# Patient Record
Sex: Female | Born: 1995 | Race: White | Hispanic: No | Smoking: Former smoker
Health system: Southern US, Community
[De-identification: ages and names within clinical notes are randomized; demographics above are authoritative.]

## PROBLEM LIST (undated history)

## (undated) ENCOUNTER — Inpatient Hospital Stay (HOSPITAL_COMMUNITY): Payer: Self-pay

## (undated) DIAGNOSIS — S022XXA Fracture of nasal bones, initial encounter for closed fracture: Secondary | ICD-10-CM

## (undated) DIAGNOSIS — F419 Anxiety disorder, unspecified: Secondary | ICD-10-CM

## (undated) DIAGNOSIS — F32A Depression, unspecified: Secondary | ICD-10-CM

## (undated) DIAGNOSIS — F329 Major depressive disorder, single episode, unspecified: Secondary | ICD-10-CM

## (undated) DIAGNOSIS — K37 Unspecified appendicitis: Secondary | ICD-10-CM

## (undated) HISTORY — PX: APPENDECTOMY: SHX54

---
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
## 2015-05-11 ENCOUNTER — Encounter (HOSPITAL_COMMUNITY): Payer: Self-pay | Admitting: *Deleted

## 2015-05-11 ENCOUNTER — Encounter (HOSPITAL_COMMUNITY): Payer: Self-pay

## 2015-05-11 ENCOUNTER — Emergency Department (HOSPITAL_COMMUNITY)
Admission: EM | Admit: 2015-05-11 | Discharge: 2015-05-11 | Disposition: A | Discharge status: Other Acute Inpatient Hospital | Payer: Medicaid Other | Attending: Emergency Medicine | Admitting: Emergency Medicine

## 2015-05-11 ENCOUNTER — Inpatient Hospital Stay (HOSPITAL_COMMUNITY)
Admission: AD | Admit: 2015-05-11 | Discharge: 2015-05-15 | DRG: 885 | Disposition: A | Discharge status: Home / Self Care | Payer: Medicaid Other | Source: Intra-hospital | Attending: Psychiatry | Admitting: Psychiatry

## 2015-05-11 DIAGNOSIS — F332 Major depressive disorder, recurrent severe without psychotic features: Principal | ICD-10-CM | POA: Diagnosis present

## 2015-05-11 DIAGNOSIS — Z62810 Personal history of physical and sexual abuse in childhood: Secondary | ICD-10-CM | POA: Diagnosis present

## 2015-05-11 DIAGNOSIS — F411 Generalized anxiety disorder: Secondary | ICD-10-CM | POA: Diagnosis present

## 2015-05-11 DIAGNOSIS — G4700 Insomnia, unspecified: Secondary | ICD-10-CM | POA: Diagnosis present

## 2015-05-11 DIAGNOSIS — F410 Panic disorder [episodic paroxysmal anxiety] without agoraphobia: Secondary | ICD-10-CM | POA: Diagnosis present

## 2015-05-11 DIAGNOSIS — F1210 Cannabis abuse, uncomplicated: Secondary | ICD-10-CM | POA: Diagnosis not present

## 2015-05-11 DIAGNOSIS — R45851 Suicidal ideations: Secondary | ICD-10-CM | POA: Diagnosis present

## 2015-05-11 DIAGNOSIS — Z818 Family history of other mental and behavioral disorders: Secondary | ICD-10-CM

## 2015-05-11 DIAGNOSIS — Z720 Tobacco use: Secondary | ICD-10-CM | POA: Insufficient documentation

## 2015-05-11 DIAGNOSIS — R4689 Other symptoms and signs involving appearance and behavior: Secondary | ICD-10-CM

## 2015-05-11 DIAGNOSIS — Z23 Encounter for immunization: Secondary | ICD-10-CM | POA: Diagnosis not present

## 2015-05-11 DIAGNOSIS — F4310 Post-traumatic stress disorder, unspecified: Secondary | ICD-10-CM | POA: Diagnosis not present

## 2015-05-11 DIAGNOSIS — F17210 Nicotine dependence, cigarettes, uncomplicated: Secondary | ICD-10-CM | POA: Diagnosis present

## 2015-05-11 DIAGNOSIS — R4589 Other symptoms and signs involving emotional state: Secondary | ICD-10-CM

## 2015-05-11 HISTORY — DX: Depression, unspecified: F32.A

## 2015-05-11 HISTORY — DX: Anxiety disorder, unspecified: F41.9

## 2015-05-11 HISTORY — DX: Major depressive disorder, single episode, unspecified: F32.9

## 2015-05-11 LAB — CBC
HEMATOCRIT: 40.1 % (ref 36.0–46.0)
HEMOGLOBIN: 14.6 g/dL (ref 12.0–15.0)
MCH: 31.8 pg (ref 26.0–34.0)
MCHC: 36.4 g/dL — AB (ref 30.0–36.0)
MCV: 87.4 fL (ref 78.0–100.0)
Platelets: 300 10*3/uL (ref 150–400)
RBC: 4.59 MIL/uL (ref 3.87–5.11)
RDW: 12.9 % (ref 11.5–15.5)
WBC: 6.4 10*3/uL (ref 4.0–10.5)

## 2015-05-11 LAB — COMPREHENSIVE METABOLIC PANEL
ALBUMIN: 4.5 g/dL (ref 3.5–5.0)
ALK PHOS: 82 U/L (ref 38–126)
ALT: 11 U/L — AB (ref 14–54)
AST: 21 U/L (ref 15–41)
Anion gap: 9 (ref 5–15)
BILIRUBIN TOTAL: 1 mg/dL (ref 0.3–1.2)
BUN: 8 mg/dL (ref 6–20)
CALCIUM: 9.4 mg/dL (ref 8.9–10.3)
CO2: 24 mmol/L (ref 22–32)
CREATININE: 0.64 mg/dL (ref 0.44–1.00)
Chloride: 103 mmol/L (ref 101–111)
GFR calc Af Amer: >60 mL/min (ref >60)
GFR calc non Af Amer: >60 mL/min (ref >60)
GLUCOSE: 113 mg/dL — AB (ref 65–99)
Potassium: 4 mmol/L (ref 3.5–5.1)
SODIUM: 136 mmol/L (ref 135–145)
TOTAL PROTEIN: 7.3 g/dL (ref 6.5–8.1)

## 2015-05-11 LAB — ETHANOL: Alcohol, Ethyl (B): <5 mg/dL (ref <5)

## 2015-05-11 LAB — ACETAMINOPHEN LEVEL

## 2015-05-11 LAB — RAPID URINE DRUG SCREEN, HOSP PERFORMED
Amphetamines: NOT DETECTED
BARBITURATES: NOT DETECTED
Benzodiazepines: NOT DETECTED
COCAINE: NOT DETECTED
Opiates: NOT DETECTED
TETRAHYDROCANNABINOL: POSITIVE — AB

## 2015-05-11 LAB — SALICYLATE LEVEL: Salicylate Lvl: <4 mg/dL (ref 2.8–30.0)

## 2015-05-11 MED ORDER — NICOTINE 21 MG/24HR TD PT24
21.0000 mg | MEDICATED_PATCH | Freq: Every day | TRANSDERMAL | Status: DC
  Administered 2015-05-12: 21 mg via TRANSDERMAL
  Filled 2015-05-11 (×5): qty 1

## 2015-05-11 MED ORDER — IBUPROFEN 200 MG PO TABS
600.0000 mg | ORAL_TABLET | Freq: Three times a day (TID) | ORAL | Status: DC | PRN
Start: 2015-05-11 — End: 2015-05-11

## 2015-05-11 MED ORDER — ACETAMINOPHEN 325 MG PO TABS
650.0000 mg | ORAL_TABLET | Freq: Four times a day (QID) | ORAL | Status: DC | PRN
  Administered 2015-05-13 – 2015-05-15 (×2): 650 mg via ORAL
  Filled 2015-05-11 (×2): qty 2

## 2015-05-11 MED ORDER — ONDANSETRON HCL 4 MG PO TABS: 4.0000 mg | ORAL_TABLET | Freq: Three times a day (TID) | ORAL | Status: DC | PRN

## 2015-05-11 MED ORDER — ACETAMINOPHEN 325 MG PO TABS: 650.0000 mg | ORAL_TABLET | ORAL | Status: DC | PRN

## 2015-05-11 MED ORDER — PNEUMOCOCCAL VAC POLYVALENT 25 MCG/0.5ML IJ INJ
0.5000 mL | INJECTION | INTRAMUSCULAR | Status: AC
  Administered 2015-05-13: 0.5 mL via INTRAMUSCULAR

## 2015-05-11 MED ORDER — HYDROXYZINE HCL 25 MG PO TABS
25.0000 mg | ORAL_TABLET | Freq: Four times a day (QID) | ORAL | Status: DC | PRN
  Administered 2015-05-11 – 2015-05-15 (×6): 25 mg via ORAL
  Filled 2015-05-11 (×6): qty 1

## 2015-05-11 MED ORDER — LORAZEPAM 1 MG PO TABS: 1.0000 mg | ORAL_TABLET | Freq: Three times a day (TID) | ORAL | Status: DC | PRN

## 2015-05-11 MED ORDER — TRAZODONE HCL 50 MG PO TABS
50.0000 mg | ORAL_TABLET | Freq: Every evening | ORAL | Status: DC | PRN
  Administered 2015-05-11 – 2015-05-12 (×2): 50 mg via ORAL
  Filled 2015-05-11 (×2): qty 1

## 2015-05-11 NOTE — ED Notes (Signed)
Pt. Stated, "i am having bad thoughts of hurting myself."    Attempted in the past of, pt. Had Overdose.  Pt. Would like to have her meds restarted.  She takes medication for anxiety and depression.
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
## 2015-05-11 NOTE — ED Notes (Signed)
Staffing called for sitter. They state it may be 1900 before they can get one.

## 2015-05-11 NOTE — Tx Team (Signed)
Initial Interdisciplinary Treatment Plan   PATIENT STRESSORS: Financial difficulties Medication change or noncompliance Substance abuse Daughter medical problems   PATIENT STRENGTHS: Ability for insight Average or above average intelligence Capable of independent living Communication skills Motivation for treatment/growth   PROBLEM LIST: Problem List/Patient Goals Date to be addressed Date deferred Reason deferred Estimated date of resolution  Depression "I want to get back on medication" 05/11/15   At d/c  anxiety 05/11/15   At d/c  Suicidal ideation "I have a daughter to care for and don't want to hurt myself" 05/11/15   At d/c  Substance abuse " I want to stop using marijuana as a medication" 05/11/15   At d/c                                 DISCHARGE CRITERIA:  Improved stabilization in mood, thinking, and/or behavior Motivation to continue treatment in a less acute level of care Need for constant or close observation no longer present Verbal commitment to aftercare and medication compliance  PRELIMINARY DISCHARGE PLAN: Outpatient therapy Return to previous living arrangement  PATIENT/FAMIILY INVOLVEMENT: This treatment plan has been presented to and reviewed with the patient, Kaitlyn Larson. The patient and family have been given the opportunity to ask questions and make suggestions.  Celene KrasRobinson, Lanissa Cashen G 05/11/2015, 9:37 PM

## 2015-05-11 NOTE — Progress Notes (Signed)
Pt admitted to Adult Unit. Pt presents calm and cooperative. Pt request help for depression, anxiety and suicidal ideation "I feel my anxiety and depression slipping back in". Pt reported wanting to go to sleep and not waking up "But I have a daughter to take care of". -HI/A/V/H. Reports multiple stressors being financial problems,  off medication, unemployed and  her 21 yo daughter getting bite by a dog in the face 6 months ago and facing many future surgeries.   Pt admits to crying spells, social withdrawal, loss of interest in usual pleasures, decreased concentration, fatigue, irritability, decreased sleep, decreased appetite and feelings of hopelessness.  Pt has a history or cutting and overdosing.  Emotional support and encouragement given. Will monitor closely and evaluate for stabilization.

## 2015-05-11 NOTE — ED Provider Notes (Signed)
CSN: [id number]     Arrival date & time 05/11/15  1545 History   First MD Initiated Contact with Patient 05/11/15 1603     Chief Complaint  Patient presents with  . Suicidal     (Consider location/radiation/quality/duration/timing/severity/associated sxs/prior Treatment) HPI Comments: Patient is a 19 year old female with a past medical history of anxiety and depression who presents with suicidal ideations. Patient reports having these "for a while" but became worse lately with increasing life stressors. Patient reports having these feelings previously and overdosed on "a bunch of pills" and was seen at a hospital in [place]. Today, patient reports she wants to go to sleep and not wake up or would try to run away and have someone kill her. No HI. Patient reports using marijuana but denies any other drug use.    Past Medical History  Diagnosis Date  . Anxiety   . Depression    Past Surgical History  Procedure Laterality Date  . Appendectomy     No family history on file. Social History  Substance Use Topics  . Smoking status: Current Every Day Smoker    Types: Cigarettes  . Smokeless tobacco: None  . Alcohol Use: No   OB History    No data available     Review of Systems  Psychiatric/Behavioral: Positive for suicidal ideas.  All other systems reviewed and are negative.     Allergies  Lactose intolerance (gi)  Home Medications   Prior to Admission medications   Not on File   BP 129/69 mmHg  Pulse 104  Temp(Src) 98.6 F (37 C) (Oral)  Resp 18  SpO2 97%  LMP 04/27/2015 Physical Exam  Constitutional: She is oriented to person, place, and time. She appears well-developed and well-nourished. No distress.  HENT:  Head: Normocephalic and atraumatic.  Eyes: Conjunctivae and EOM are normal.  Neck: Normal range of motion.  Cardiovascular: Normal rate and regular rhythm.  Exam reveals no gallop and no friction rub.   No murmur heard. Pulmonary/Chest: Effort normal  and breath sounds normal. She has no wheezes. She has no rales. She exhibits no tenderness.  Abdominal: Soft. She exhibits no distension. There is no tenderness. There is no rebound.  Musculoskeletal: Normal range of motion.  Neurological: She is alert and oriented to person, place, and time. Coordination normal.  Speech is goal-oriented. Moves limbs without ataxia.   Skin: Skin is warm and dry.  Psychiatric: She has a normal mood and affect. Her behavior is normal.  Nursing note and vitals reviewed.   ED Course  Procedures (including critical care time) Labs Review Labs Reviewed  COMPREHENSIVE METABOLIC PANEL - Abnormal; Notable for the following:    Glucose, Bld 113 (*)    ALT 11 (*)    All other components within normal limits  ACETAMINOPHEN LEVEL - Abnormal; Notable for the following:    Acetaminophen (Tylenol), Serum <10 (*)    All other components within normal limits  CBC - Abnormal; Notable for the following:    MCHC 36.4 (*)    All other components within normal limits  URINE RAPID DRUG SCREEN, HOSP PERFORMED - Abnormal; Notable for the following:    Tetrahydrocannabinol POSITIVE (*)    All other components within normal limits  ETHANOL  SALICYLATE LEVEL    Imaging Review No results found. I have personally reviewed and evaluated these images and lab results as part of my medical decision-making.   EKG Interpretation None      MDM  Final diagnoses:  Suicidal behavior    4:26 PM Patient's labs pending. Patient will be moved to Pod C for telepsych.   Patient has been accepted to a treatment facility and will be discharged and transferred via Evans Lance, PA-C 05/11/15 1610  Arby Barrette, MD 05/12/15 [id number]

## 2015-05-12 ENCOUNTER — Encounter (HOSPITAL_COMMUNITY): Payer: Self-pay | Admitting: Psychiatry

## 2015-05-12 DIAGNOSIS — R45851 Suicidal ideations: Secondary | ICD-10-CM

## 2015-05-12 DIAGNOSIS — F4310 Post-traumatic stress disorder, unspecified: Secondary | ICD-10-CM | POA: Diagnosis present

## 2015-05-12 DIAGNOSIS — F332 Major depressive disorder, recurrent severe without psychotic features: Principal | ICD-10-CM

## 2015-05-12 MED ORDER — ESCITALOPRAM OXALATE 5 MG PO TABS
5.0000 mg | ORAL_TABLET | Freq: Every day | ORAL | Status: DC
  Administered 2015-05-12 – 2015-05-13 (×2): 5 mg via ORAL
  Filled 2015-05-12 (×3): qty 1

## 2015-05-12 NOTE — Progress Notes (Signed)
D) Pt rated her depression at a 9, her hopelessness at a 7 and her anxiety at a 9. States that she was having suicidal thoughts today.  States, "ever since I took the medicine that the doctor put me on today, I have been feeling much better". Went outside with the group and became anxious due to riding in the elevator. States, "small spaces make me upset and cause me to be anxious. A) Given support, reassurance and praise. Provided with a 1:1. Encouragement given. Pt asked for and received Vistaril for her anxiety. R) Presently Pt is feeling better and denies SI and HI. Feels that the medication that was started today has help her greatly already.

## 2015-05-12 NOTE — Progress Notes (Signed)
D.  Pt pleasant on approach, denies complaints at this time.   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  Pt is interacting appropriately within milieu.  Denies SI/HI/hallucinations at this time.  A.  Support and encouragement offered, medications given as ordered.  R.  Pt remains safe on unit, will continue to monitor.
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
## 2015-05-12 NOTE — Progress Notes (Signed)
Patient ID: Rolinda RoanChastity J Larson, female   DOB: 04/30/1996, 19 y.o.   MRN: [id number]  Adult Psychoeducational Group Note  Date:  05/12/2015 Time: 09:30am  Group Topic/Focus:  Making Healthy Choices:   The focus of this group is to help patients identify negative/unhealthy choices they were using prior to admission and identify positive/healthier coping strategies to replace them upon discharge.  Participation Level:  Did Not Attend  Participation Quality: n/a  Affect: n/a  Cognitive: n/a  Insight: n/a  Engagement in Group: n/a  Modes of Intervention:  Activity, Education and Support  Additional Comments:  Pt did not attend group. Pt in bed asleep.   Aurora Maskwyman, Tearah Saulsbury E 05/12/2015, 10:03 AM

## 2015-05-12 NOTE — Plan of Care (Signed)
Problem: Diagnosis: Increased Risk For Suicide Attempt Goal: STG-Patient Will Attend All Groups On The Unit Outcome: Progressing Pt did attend evening wrap up group tonight
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
## 2015-05-12 NOTE — [facility] Group Notes (Signed)
[facility] Group Notes:  (Clinical Social Work)  05/12/2015  10:00-11:00AM  Summary of Progress/Problems:   The main focus of today's process group was to   1)  discuss the importance of adding supports  2)  define health supports versus unhealthy supports  3)  identify the patient's current unhealthy supports and plan how to handle them  4)  Identify the patient's current healthy supports and plan what to add.  An emphasis was placed on using counselor, doctor, therapy groups, 12-step groups, and problem-specific support groups to expand supports.    The patient expressed full comprehension of the concepts presented, and agreed that there is a need to add more supports.  The patient left for another group on another hall after saying her supports include her husband, mother, and mother-in-law.  Type of Therapy:  Process Group with Motivational Interviewing  Participation Level:  Active  Participation Quality:  Attentive and Sharing  Affect:  Blunted  Cognitive:  Alert  Insight:  Engaged  Engagement in Therapy:  Engaged  Modes of Intervention:   Education, Teacher, English as a foreign languageupport and Processing, Activity  Ambrose MantleMareida Grossman-Orr, LCSW 05/12/2015

## 2015-05-12 NOTE — [facility] Suicide Risk Assessment (Signed)
[facility]   Nursing information obtained from:  Patient Demographic factors:  Adolescent or young adult, Caucasian, Low socioeconomic status, Unemployed Current Mental Status:  Self-harm thoughts Loss Factors:  Financial problems / change in socioeconomic status Historical Factors:  Prior suicide attempts, Family history of suicide, Family history of mental illness or substance abuse Risk Reduction Factors:  Responsible for children under 21 years of age, Living with another person, especially a relative, Positive social support Total Time spent with patient: 45 minutes Principal Problem: MDD (major depressive disorder), recurrent episode, severe ([facility]) Diagnosis:   Patient Active Problem List   Diagnosis Date Noted  . PTSD (post-traumatic stress disorder) [F43.10] 05/12/2015  . MDD (major depressive disorder), recurrent episode, severe ([facility]) [F33.2] 05/11/2015     Continued Clinical Symptoms:  Alcohol Use Disorder Identification Test Final Score (AUDIT): 1 The "Alcohol Use Disorders Identification Test", Guidelines for Use in Primary Care, Second Edition.  World Science writerHealth Organization [facility](WHO). Score between 0-7:  no or low risk or alcohol related problems. Score between 8-15:  moderate risk of alcohol related problems. Score between 16-19:  high risk of alcohol related problems. Score 20 or above:  warrants further diagnostic evaluation for alcohol dependence and treatment.   CLINICAL FACTORS: Depression/severe    Psychiatric Specialty Exam: Physical Exam  ROS  Blood pressure 108/70, pulse 77, temperature 98.1 F (36.7 C), temperature source Oral, resp. rate 16, height 5\' 2"  (1.575 m), weight 56.7 kg (125 lb), last menstrual period 04/27/2015.Body mass index is 22.86 kg/(m^2).    COGNITIVE FEATURES THAT CONTRIBUTE TO RISK:  Closed-mindedness, Polarized thinking and Thought constriction (tunnel vision)    SUICIDE RISK:   Moderate:  Frequent suicidal  ideation with limited intensity, and duration, some specificity in terms of plans, no associated intent, good self-control, limited dysphoria/symptomatology, some risk factors present, and identifiable protective factors, including available and accessible social support.  PLAN OF CARE: see admission H and P  Medical Decision Making:  Review of Psycho-Social Stressors (1), Review or order clinical lab tests (1), Review of Medication Regimen & Side Effects (2) and Review of New Medication or Change in Dosage (2)  I certify that inpatient services furnished can reasonably be expected to improve the patient's condition.   Kaitlyn Larson A 05/12/2015, 5:36 PM

## 2015-05-13 MED ORDER — TRAZODONE HCL 50 MG PO TABS
50.0000 mg | ORAL_TABLET | Freq: Every day | ORAL | Status: DC
  Administered 2015-05-13 – 2015-05-14 (×2): 50 mg via ORAL
  Filled 2015-05-13 (×4): qty 1

## 2015-05-13 MED ORDER — NICOTINE POLACRILEX 2 MG MT GUM
2.0000 mg | CHEWING_GUM | OROMUCOSAL | Status: DC | PRN
  Administered 2015-05-13: 2 mg via ORAL
  Filled 2015-05-13: qty 1

## 2015-05-13 MED ORDER — ESCITALOPRAM OXALATE 10 MG PO TABS
10.0000 mg | ORAL_TABLET | Freq: Every day | ORAL | Status: DC
  Administered 2015-05-14 – 2015-05-15 (×2): 10 mg via ORAL
  Filled 2015-05-13 (×4): qty 1

## 2015-05-13 MED ORDER — IBUPROFEN 800 MG PO TABS
800.0000 mg | ORAL_TABLET | Freq: Four times a day (QID) | ORAL | Status: DC | PRN
  Administered 2015-05-14: 800 mg via ORAL
  Filled 2015-05-13: qty 1

## 2015-05-13 NOTE — Progress Notes (Signed)
[facility] MD Progress Note  05/13/2015 3:47 PM Kaitlyn Larson  MRN:  [id number] Subjective:  Roda states she is having a hard time being away from her daughter. She states she really wants to get stronger to be able to get a job and get a place for her and her daughter. States she is tired of asking her husband to get a job or to keep a job. States she needs to get out from where she is with his family. States she loves him but she needs to move on Principal Problem: MDD (major depressive disorder), recurrent episode, severe ([place]) Diagnosis:   Patient Active Problem List   Diagnosis Date Noted  . PTSD (post-traumatic stress disorder) [F43.10] 05/12/2015  . MDD (major depressive disorder), recurrent episode, severe ([place]) [F33.2] 05/11/2015   Total Time spent with patient: 30 minutes  Past Psychiatric History: See Admission H and P  Past Medical History:  Past Medical History  Diagnosis Date  . Anxiety   . Depression     Past Surgical History  Procedure Laterality Date  . Appendectomy     Family History: History reviewed. No pertinent family history. Family Psychiatric  History: See Admission H and P Social History:  History  Alcohol Use No     History  Drug Use  . Yes  . Special: Marijuana    Social History   Social History  . Marital Status: Married    Spouse Name: N/A  . Number of Children: N/A  . Years of Education: N/A   Social History Main Topics  . Smoking status: Current Every Day Smoker    Types: Cigarettes  . Smokeless tobacco: None  . Alcohol Use: No  . Drug Use: Yes    Special: Marijuana  . Sexual Activity: Yes    Birth Control/ Protection: None   Other Topics Concern  . None   Social History Narrative   Additional Social History:    Pain Medications: denies Prescriptions: denies Over the Counter: denies History of alcohol / drug use?: Yes Longest period of sobriety (when/how long): a few months Name of Substance 1: marijuana 1 - Age of  First Use: 14 1 - Amount (size/oz): variable 1 - Frequency: 2 x week 1 - Duration: years 1 - Last Use / Amount: yesterday Name of Substance 2: alcohol 2 - Amount (size/oz): 1 glass 2 - Frequency: 1 x month                Sleep: Fair  Appetite:  Poor  Current Medications: Current Facility-Administered Medications  Medication Dose Route Frequency Provider Last Rate Last Dose  . acetaminophen (TYLENOL) tablet 650 mg  650 mg Oral Q6H PRN Lurena Nida, NP   650 mg at 05/13/15 1258  . [START ON 05/14/2015] escitalopram (LEXAPRO) tablet 10 mg  10 mg Oral Daily Nicholaus Bloom, MD      . hydrOXYzine (ATARAX/VISTARIL) tablet 25 mg  25 mg Oral Q6H PRN Lurena Nida, NP   25 mg at 05/12/15 2155  . ibuprofen (ADVIL,MOTRIN) tablet 800 mg  800 mg Oral Q6H PRN Nicholaus Bloom, MD      . nicotine polacrilex (NICORETTE) gum 2 mg  2 mg Oral PRN Nicholaus Bloom, MD   2 mg at 05/13/15 1342  . traZODone (DESYREL) tablet 50 mg  50 mg Oral QHS Nicholaus Bloom, MD        Lab Results:  Results for orders placed or performed during the hospital encounter  of 05/11/15 (from the past 48 hour(s))  Ethanol (ETOH)     Status: None   Collection Time: 05/11/15  4:17 PM  Result Value Ref Range   Alcohol, Ethyl (B) <5 <5 mg/dL    Comment:        LOWEST DETECTABLE LIMIT FOR SERUM ALCOHOL IS 5 mg/dL FOR MEDICAL PURPOSES ONLY   Salicylate level     Status: None   Collection Time: 05/11/15  4:17 PM  Result Value Ref Range   Salicylate Lvl <5.4 2.8 - 30.0 mg/dL  Acetaminophen level     Status: Abnormal   Collection Time: 05/11/15  4:17 PM  Result Value Ref Range   Acetaminophen (Tylenol), Serum <10 (L) 10 - 30 ug/mL    Comment:        THERAPEUTIC CONCENTRATIONS VARY SIGNIFICANTLY. A RANGE OF 10-30 ug/mL MAY BE AN EFFECTIVE CONCENTRATION FOR MANY PATIENTS. HOWEVER, SOME ARE BEST TREATED AT CONCENTRATIONS OUTSIDE THIS RANGE. ACETAMINOPHEN CONCENTRATIONS >150 ug/mL AT 4 HOURS AFTER INGESTION AND >50 ug/mL  AT 12 HOURS AFTER INGESTION ARE OFTEN ASSOCIATED WITH TOXIC REACTIONS.   Comprehensive metabolic panel     Status: Abnormal   Collection Time: 05/11/15  4:22 PM  Result Value Ref Range   Sodium 136 135 - 145 mmol/L   Potassium 4.0 3.5 - 5.1 mmol/L   Chloride 103 101 - 111 mmol/L   CO2 24 22 - 32 mmol/L   Glucose, Bld 113 (H) 65 - 99 mg/dL   BUN 8 6 - 20 mg/dL   Creatinine, Ser 0.64 0.44 - 1.00 mg/dL   Calcium 9.4 8.9 - 10.3 mg/dL   Total Protein 7.3 6.5 - 8.1 g/dL   Albumin 4.5 3.5 - 5.0 g/dL   AST 21 15 - 41 U/L   ALT 11 (L) 14 - 54 U/L   Alkaline Phosphatase 82 38 - 126 U/L   Total Bilirubin 1.0 0.3 - 1.2 mg/dL   GFR calc non Af Amer >60 >60 mL/min   GFR calc Af Amer >60 >60 mL/min    Comment: (NOTE) The eGFR has been calculated using the CKD EPI equation. This calculation has not been validated in all clinical situations. eGFR's persistently <60 mL/min signify possible Chronic Kidney Disease.    Anion gap 9 5 - 15  CBC     Status: Abnormal   Collection Time: 05/11/15  4:22 PM  Result Value Ref Range   WBC 6.4 4.0 - 10.5 K/uL   RBC 4.59 3.87 - 5.11 MIL/uL   Hemoglobin 14.6 12.0 - 15.0 g/dL   HCT 40.1 36.0 - 46.0 %   MCV 87.4 78.0 - 100.0 fL   MCH 31.8 26.0 - 34.0 pg   MCHC 36.4 (H) 30.0 - 36.0 g/dL   RDW 12.9 11.5 - 15.5 %   Platelets 300 150 - 400 K/uL  Urine rapid drug screen (hosp performed) (Not at [facility])     Status: Abnormal   Collection Time: 05/11/15  4:34 PM  Result Value Ref Range   Opiates NONE DETECTED NONE DETECTED   Cocaine NONE DETECTED NONE DETECTED   Benzodiazepines NONE DETECTED NONE DETECTED   Amphetamines NONE DETECTED NONE DETECTED   Tetrahydrocannabinol POSITIVE (A) NONE DETECTED   Barbiturates NONE DETECTED NONE DETECTED    Comment:        DRUG SCREEN FOR MEDICAL PURPOSES ONLY.  IF CONFIRMATION IS NEEDED FOR ANY PURPOSE, NOTIFY LAB WITHIN 5 DAYS.        LOWEST DETECTABLE LIMITS FOR URINE  DRUG SCREEN Drug Class       Cutoff  (ng/mL) Amphetamine      1000 Barbiturate      200 Benzodiazepine   545 Tricyclics       625 Opiates          300 Cocaine          300 THC              50     Physical Findings: AIMS: Facial and Oral Movements Muscles of Facial Expression: None, normal Lips and Perioral Area: None, normal Jaw: None, normal Tongue: None, normal,Extremity Movements Upper (arms, wrists, hands, fingers): None, normal Lower (legs, knees, ankles, toes): None, normal, Trunk Movements Neck, shoulders, hips: None, normal, Overall Severity Severity of abnormal movements (highest score from questions above): None, normal Incapacitation due to abnormal movements: None, normal Patient's awareness of abnormal movements (rate only patient's report): No Awareness, Dental Status Current problems with teeth and/or dentures?: No Does patient usually wear dentures?: No  CIWA:    COWS:     Musculoskeletal: Strength & Muscle Tone: within normal limits Gait & Station: normal Patient leans: normal  Psychiatric Specialty Exam: Review of Systems  Constitutional: Negative.   HENT: Negative.   Eyes: Negative.   Respiratory: Negative.   Cardiovascular: Negative.   Gastrointestinal: Negative.   Genitourinary: Negative.   Musculoskeletal: Negative.   Skin: Negative.   Neurological: Negative.   Endo/Heme/Allergies: Negative.   Psychiatric/Behavioral: Positive for depression. The patient is nervous/anxious.     Blood pressure 81/56, pulse 67, temperature 98.1 F (36.7 C), temperature source Oral, resp. rate 18, height $RemoveBe'5\' 2"'pYcaOuJJb$  (1.575 m), weight 56.7 kg (125 lb), last menstrual period 04/27/2015.Body mass index is 22.86 kg/(m^2).  General Appearance: Fairly Groomed  Engineer, water::  Fair  Speech:  Clear and Coherent  Volume:  Decreased  Mood:  Anxious and Depressed  Affect:  anxious worried  Thought Process:  Coherent and Goal Directed  Orientation:  Full (Time, Place, and Person)  Thought Content:  symptoms  events worries concerns  Suicidal Thoughts:  No  Homicidal Thoughts:  No  Memory:  Immediate;   Fair Recent;   Fair Remote;   Fair  Judgement:  Fair  Insight:  Present  Psychomotor Activity:  Restlessness  Concentration:  Fair  Recall:  [employer] of Knowledge:Fair  Language: Fair  Akathisia:  No  Handed:  Right  AIMS (if indicated):     Assets:  Desire for Improvement  ADL's:  Intact  Cognition: WNL  Sleep:  Number of Hours: 5.5   Treatment Plan Summary: Daily contact with patient to assess and evaluate symptoms and progress in treatment and Medication management Supportive approach/coping skills Depression; will continue to work with the Lexapro, will increase the dose to 10 mg. Will work with CBT/mindfulness PTSD; will help process the losses her daughters trauma her irrational guilt etc.  Insomnia; will continue to work with the Trazodone 50 mg HS  Ceira Hoeschen A 05/13/2015, 3:47 PM
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
## 2015-05-13 NOTE — [facility] Group Notes (Signed)
[facility]   05/13/2015 10:57 AM  Participation Quality:  Invited- DID NOT ATTEND. Chose to remain in bed.   Smart, American FinancialHeather LCSWA

## 2015-05-13 NOTE — Progress Notes (Signed)
[facility] Group Notes:  (Nursing/MHT/Case Management/Adjunct)  Date:  05/13/2015  Time:  1:27 AM  Type of Therapy:  Psychoeducational Skills  Participation Level:  Active  Participation Quality:  Appropriate  Affect:  Appropriate  Cognitive:  Appropriate  Insight:  Appropriate  Engagement in Group:  Engaged  Modes of Intervention:  Education  Summary of Progress/Problems: Patient expressed in group that she had a better day today since she is beginning to feel better and because she feels that the medication is beginning to work. As for the theme of the day, her support will consist of her husband and mother.   Donnajean Chesnut S 05/13/2015, 1:27 AM

## 2015-05-13 NOTE — Progress Notes (Signed)
Nutrition Brief Note  Patient identified on the Malnutrition Screening Tool (MST) Report  Wt Readings from Last 15 Encounters:  05/11/15 125 lb (56.7 kg)    Body mass index is 22.86 kg/(m^2). Patient meets criteria for normal range based on current BMI.   Diet Order: Diet regular Room service appropriate?: Yes; Fluid consistency:: Thin Pt is also offered choice of unit snacks mid-morning and mid-afternoon.   Labs and medications reviewed.   No nutrition interventions warranted at this time. If nutrition issues arise, please consult RD.   Tilda FrancoLindsey Jevin Camino, MS, RD, LDN Pager: [phone] After Hours Pager: [phone]
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
## 2015-05-13 NOTE — Progress Notes (Signed)
D) Pt has been isolative to her room. Seclusive to self. Pt has not attended groups or unit activities however pt has been up for meals. Kaitlyn Larson c/o headache this a.m. But did not want any medication for this. Pt appears anxious and depressed in mood and affect. A) Level 3 obs for safety, support and encouragement provided. Med ed reinforced. Medication offered for c/o headache. Fluids offered for decreased bp. Contract for safety. R) Guarded.

## 2015-05-13 NOTE — Progress Notes (Signed)
Recreation Therapy Notes  Date: 10.17.2016 Time: 9:30am Location: 300 Hall Dayroom   Group Topic: Stress Management  Goal Area(s) Addresses:  Patient will actively participate in stress management techniques presented during session.   Behavioral Response: Did not attend.   Marykay Lexenise L Quanita Barona, LRT/CTRS        Adlee Paar L 05/13/2015 1:55 PM

## 2015-05-13 NOTE — Progress Notes (Addendum)
Pt attended group on loss and grief facilitated by counseling intern Graciela HusbandsKathryn Cristofer Yaffe. Group goal of identifying grief patterns, naming feelings / responses to grief, identifying behaviors that may emerge from grief responses, identifying when one may call on an ally or coping skill.  Following introductions and group rules, group opened with psycho-social ed. identifying types of loss (relationships / self / things) and identifying patterns, circumstances, and changes that precipitate losses. Group members spoke about losses they had experienced and the effect of those losses on their lives. Facilitated sharing feelings and thoughts with one another in order to normalize grief responses, as well as recognize variety in grief experience.  Group facilitation drew on brief cognitive behavioral and Adlerian theory as well as Gestalt theory.  Pt presented as oriented x4 with appropriate affect and mood. Pt was actively engaged during group. She discussed feelings of loss related to her daughter's reconstructive surgeries following a dog bite. The pt indicated that this incident resulted in a change of relationship with her mother due to the fact that it was her dog. The pt mentioned feelings of anger and loss as well as the need for accountability.   Graciela HusbandsKathryn Marian Grandt Counseling Intern

## 2015-05-14 MED ORDER — NICOTINE 21 MG/24HR TD PT24
21.0000 mg | MEDICATED_PATCH | Freq: Every day | TRANSDERMAL | Status: DC
  Administered 2015-05-14 – 2015-05-15 (×2): 21 mg via TRANSDERMAL
  Filled 2015-05-14 (×3): qty 1

## 2015-05-14 MED ORDER — INFLUENZA VAC SPLIT QUAD 0.5 ML IM SUSY
0.5000 mL | PREFILLED_SYRINGE | INTRAMUSCULAR | Status: AC
Start: 2015-05-15 — End: 2015-05-15
  Administered 2015-05-15: 0.5 mL via INTRAMUSCULAR
  Filled 2015-05-14: qty 0.5

## 2015-05-14 NOTE — Progress Notes (Signed)
[facility]  05/14/2015 5:33 PM Kaitlyn RoanChastity J Larson  MRN:  [id number] Subjective:  Rainey continues to work towards getting her life back together. States that being away has help to see things more clearly. She does not want to go back with her husband.  States if she does it is going to be more of the same thing and she needs to be well for her daughter. She is going to stay with her mother at least temporarily. She states when she is at her mother's she wants to be mother to her daughter. She wants to be able to get her own place where she can take her. She admits that since her daughter was attacked by the dog she does not want to stay far from her Principal Problem: MDD (major depressive disorder), recurrent episode, severe ([facility]) Diagnosis:   Patient Active Problem List   Diagnosis Date Noted  . PTSD (post-traumatic stress disorder) [F43.10] 05/12/2015  . MDD (major depressive disorder), recurrent episode, severe ([facility]) [F33.2] 05/11/2015   Total Time spent with patient: 30 minutes  Past Psychiatric History: see Admission H and P  Past Medical History:  Past Medical History  Diagnosis Date  . Anxiety   . Depression     Past Surgical History  Procedure Laterality Date  . Appendectomy     Family History: History reviewed. No pertinent family history. Family Psychiatric  History: see admission H and P Social History:  History  Alcohol Use No     History  Drug Use  . Yes  . Special: Marijuana    Social History   Social History  . Marital Status: Married    Spouse Name: N/A  . Number of Children: N/A  . Years of Education: N/A   Social History Main Topics  . Smoking status: Current Every Day Smoker    Types: Cigarettes  . Smokeless tobacco: None  . Alcohol Use: No  . Drug Use: Yes    Special: Marijuana  . Sexual Activity: Yes    Birth Control/ Protection: None   Other Topics Concern  . None   Social History Narrative   Additional Social History:     Pain Medications: denies Prescriptions: denies Over the Counter: denies History of alcohol / drug use?: Yes Longest period of sobriety (when/how long): a few months Name of Substance 1: marijuana 1 - Age of First Use: 14 1 - Amount (size/oz): variable 1 - Frequency: 2 x week 1 - Duration: years 1 - Last Use / Amount: yesterday Name of Substance 2: alcohol 2 - Amount (size/oz): 1 glass 2 - Frequency: 1 x month                Sleep: Fair  Appetite:  Fair  Current Medications: Current Facility-Administered Medications  Medication Dose Route Frequency Provider Last Rate Last Dose  . acetaminophen (TYLENOL) tablet 650 mg  650 mg Oral Q6H PRN Kristeen MansFran E Hobson, NP   650 mg at 05/13/15 1258  . escitalopram (LEXAPRO) tablet 10 mg  10 mg Oral Daily Rachael FeeIrving A Dillon Mcreynolds, MD   10 mg at 05/14/15 0817  . hydrOXYzine (ATARAX/VISTARIL) tablet 25 mg  25 mg Oral Q6H PRN Kristeen MansFran E Hobson, NP   25 mg at 05/13/15 1744  . ibuprofen (ADVIL,MOTRIN) tablet 800 mg  800 mg Oral Q6H PRN Rachael FeeIrving A Delray Reza, MD      . nicotine (NICODERM CQ - dosed in mg/24 hours) patch 21 mg  21 mg Transdermal Daily Madie RenoIrving A  Dub Mikes, MD   21 mg at 05/14/15 1258  . nicotine polacrilex (NICORETTE) gum 2 mg  2 mg Oral PRN Rachael Fee, MD   2 mg at 05/13/15 1342  . traZODone (DESYREL) tablet 50 mg  50 mg Oral QHS Rachael Fee, MD   50 mg at 05/13/15 2103    Lab Results: No results found for this or any previous visit (from the past 48 hour(s)).  Physical Findings: AIMS: Facial and Oral Movements Muscles of Facial Expression: None, normal Lips and Perioral Area: None, normal Jaw: None, normal Tongue: None, normal,Extremity Movements Upper (arms, wrists, hands, fingers): None, normal Lower (legs, knees, ankles, toes): None, normal, Trunk Movements Neck, shoulders, hips: None, normal, Overall Severity Severity of abnormal movements (highest score from questions above): None, normal Incapacitation due to abnormal movements: None,  normal Patient's awareness of abnormal movements (rate only patient's report): No Awareness, Dental Status Current problems with teeth and/or dentures?: No Does patient usually wear dentures?: No  CIWA:    COWS:     Musculoskeletal: Strength & Muscle Tone: within normal limits Gait & Station: normal Patient leans: normal  Psychiatric Specialty Exam: Review of Systems  Constitutional: Negative.   HENT: Negative.   Eyes: Negative.   Respiratory: Negative.   Cardiovascular: Negative.   Gastrointestinal: Negative.   Genitourinary: Negative.   Musculoskeletal: Negative.   Skin: Negative.   Neurological: Negative.   Endo/Heme/Allergies: Negative.   Psychiatric/Behavioral: Positive for depression. The patient is nervous/anxious.     Blood pressure 91/62, pulse 86, temperature 98.8 F (37.1 C), temperature source Oral, resp. rate 16, height  (1.575 m), weight 56.7 kg (125 lb), last menstrual period 04/27/2015.Body mass index is 22.86 kg/(m^2).  General Appearance: Fairly Groomed  Patent attorney::  Fair  Speech:  Clear and Coherent  Volume:  Normal  Mood:  Anxious and worried  Affect:  anxious worried  Thought Process:  Coherent and Goal Directed  Orientation:  Full (Time, Place, and Person)  Thought Content:  symptoms events worries concerns  Suicidal Thoughts:  No  Homicidal Thoughts:  No  Memory:  Immediate;   Fair Recent;   Fair Remote;   Fair  Judgement:  Fair  Insight:  Present and Shallow  Psychomotor Activity:  Restlessness  Concentration:  Fair  Recall:  [employer] of Knowledge:Fair  Language: Fair  Akathisia:  No  Handed:  Right  AIMS (if indicated):     Assets:  Desire for Improvement Housing  ADL's:  Intact  Cognition: WNL  Sleep:  Number of Hours: 6.75   Treatment Plan Summary: Daily contact with patient to assess and evaluate symptoms and progress in treatment and Medication management Supportive approach/coping skills Depression; will continue the  Lexapro 10 mg daily PTSD; will continue to help process the trauma and how it continues to affect her today Will use CBT/mindfulness Yamil Oelke A 05/14/2015, 5:33 PM

## 2015-05-14 NOTE — Progress Notes (Signed)
Nursing Note: 0700-1900  D:  Pt presented depressed and quiet early in shift, later noted that she was animated and cheerful.   Self Inventory: Pt rates depression as 4/10, hopelessness 6/10 and anxiety  0/10 today.  Pt states, "I am looking forward to going home tomorrow, this admission has helped me to get better."   A:  Encouraged to verbalize needs and concerns, active listening and support provided.  Continued Q 15 minute safety checks.  Pt. is taking meds as prescribed, started nicotine patch in place of gum " just not working for me."  Observed interacting with peers and active participation in group. During visiting hours, pt reported "I am having a panic attack, I think its because I don't have any visitors."  Vistaril 25mg  PO given for anxiety.  R:  Pt. denies A/V hallucinations, SI/HI and is currently able to verbally contract for safety.

## 2015-05-14 NOTE — Clinical Social Work Note (Signed)
Pt reports that she has been struggling with depression for "years" and has not been compliant with medications because she could not afford them. Pt continues to rate depression as high today, denies SI/HI/AVH. She presents with depressed mood/flat affect and brightens upon approach. Pt has been placed on Lexapro (10mg ) QD for depressive symptoms and Trazodone 50mg  QHS to assist with sleep. Pt reports improving sleep. She plans to discharge on Wednesday and will live with her mother due to problems with her husband. [facility] [facility] at [facility] and has been given information to the [facility] for peer support counseling and mental health support groups.   Trula SladeHeather Smart, LCSWA Clinical Social Worker 05/14/2015 3:47 PM

## 2015-05-14 NOTE — Progress Notes (Signed)
Adult Psychoeducational Group Note  Date:  05/14/2015 Time:  8:38 PM  Group Topic/Focus:  Wrap-Up Group:   The focus of this group is to help patients review their daily goal of treatment and discuss progress on daily workbooks.  Participation Level:  Active  Participation Quality:  Appropriate  Affect:  Appropriate  Cognitive:  Appropriate  Insight: Good  Engagement in Group:  Engaged  Modes of Intervention:  Discussion  Additional Comments:  Pt rated overall day a 6 out of 10 because "the therapy dog came today", which was the highlight of her day. Pt reported that she had an anxiety attack today. Pt also reported that her goal for the day was to get her discharge papers done, which she feels that she achieved.   Cleotilde NeerJasmine S Jestine Bicknell 05/14/2015, 10:34 PM

## 2015-05-14 NOTE — Progress Notes (Signed)
D: Patient alert and oriented x 4. Patient denies pain/SI/HI/AVH. Patient is currently in bed resting with eyes closed.  A: Staff to monitor Q 15 mins for safety. Encouragement and support offered. Scheduled medications administered per orders. R: Patient remains safe on the unit. Patient attended group tonight. Patient visible on hte unit and interacting with peers. Patient taking administered medications.

## 2015-05-14 NOTE — [facility] Group Notes (Signed)
[facility] Group Notes:  Recovery  Date:  05/14/2015  Time:  9:49 AM  Type of Therapy:  Nurse Education  Participation Level:  None  Participation Quality:  Inattentive  Affect:  Depressed  Cognitive:  Lacking  Insight:  None  Engagement in Group:  None  Modes of Intervention:  Discussion  Summary of Progress/Problems:Pt did not attend  Kaitlyn KeyWebb, Kaitlyn Larson [facility] 05/14/2015, 9:49 AM

## 2015-05-14 NOTE — Progress Notes (Signed)
Recreation Therapy Notes  Animal-Assisted Activity (AAA) Program Checklist/Progress Notes Patient Eligibility Criteria Checklist & Daily Group note for Rec Tx Intervention  Date: 10.18.2016  Time: 2:45pm Location: [address] Dayroom    AAA/T Program Assumption of Risk Form signed by Patient/ or Parent Legal Guardian yes  Patient is free of allergies or sever asthma yes  Patient reports no fear of animals yes  Patient reports no history of cruelty to animals yes  Patient understands his/her participation is voluntary yes  Patient washes hands before animal contact yes  Patient washes hands after animal contact yes  Behavioral Response: Appropriate   Education: Hand Washing, Appropriate Animal Interaction   Education Outcome: Acknowledges education.   Clinical Observations/Feedback: Patient actively engaged in session, petting therapy dog appropriately and interacting with peers appropriately.   Kaitlyn Larson, LRT/CTRS        Kaitlyn Larson L 05/14/2015 3:07 PM 

## 2015-05-14 NOTE — [facility] Group Notes (Signed)
[facility] LCSW Group Therapy  05/14/2015 1:04 PM  Type of Therapy:  Group Therapy  Participation Level:  Active  Participation Quality:  Attentive  Affect:  Appropriate  Cognitive:  Alert and Oriented  Insight:  Engaged  Engagement in Therapy:  Engaged  Modes of Intervention:  Discussion, Education, Exploration, Problem-solving, Rapport Building, Socialization and Support  Summary of Progress/Problems: MHA Speaker came to talk about his personal journey with substance abuse and addiction. The pt processed ways by which to relate to the speaker. MHA speaker provided handouts and educational information pertaining to groups and services offered by the [facility].    Smart, Napoleon Monacelli LCSWA  05/14/2015, 1:04 PM

## 2015-05-15 DIAGNOSIS — F332 Major depressive disorder, recurrent severe without psychotic features: Secondary | ICD-10-CM | POA: Insufficient documentation

## 2015-05-15 MED ORDER — HYDROXYZINE HCL 25 MG PO TABS: 25.0000 mg | ORAL_TABLET | Freq: Four times a day (QID) | ORAL | Status: DC | PRN

## 2015-05-15 MED ORDER — TRAZODONE HCL 50 MG PO TABS: 50.0000 mg | ORAL_TABLET | Freq: Every day | ORAL | Status: DC

## 2015-05-15 MED ORDER — ESCITALOPRAM OXALATE 10 MG PO TABS: 10.0000 mg | ORAL_TABLET | Freq: Every day | ORAL | Status: DC

## 2015-05-15 NOTE — Progress Notes (Signed)
  [facility] :  Will you be returning to the same living situation after discharge:  Yes,  pt plans to return home with husband but may live with her mother At discharge, do you have transportation home?: Yes,  husband Do you have the ability to pay for your medications: Yes,  [place]  Release of information consent forms completed and submitted to medical records by CSW.  Patient to Follow up at: Follow-up Information    Follow up with [facility].   Why:  Walk in between 8am-9am Monday through Friday for hospital follow-up/medication management/assessment for mental health services.    Contact information:   [address]  [place], [place] [address] Phone: [phone] Fax: [phone]      [facility] On 05/23/2015.   Why:  Appt on this date at 3:00PM with Rudi RummageSharon Burkett for counseling. Please bring Medicaid card to this appt. Please call to cancel/reschedule within 48 hours of appt if necessary.    Contact information:   [facility] [address] [place], [place] [address] Phone: [phone] Fax: [phone]      Patient denies SI/HI: Yes,  during group/self report.     Safety Planning and Suicide Prevention discussed: Yes,  SPE completed with pt's mother. SPI pamphlet provided to pt and she was encouraged to share information with support network, ask questions, and talk about any concerns relating to SPE.  Have you used any form of tobacco in the last 30 days? (Cigarettes, Smokeless Tobacco, Cigars, and/or Pipes): Yes  Has patient been referred to the Quitline?: Yes, faxed on 05/15/15  Smart, Taletha Twiford LCSWA  05/15/2015, 11:14 AM

## 2015-05-15 NOTE — Progress Notes (Signed)
Discharge Note:  Patient discharged home with family member.  Patient denied SI and HI.  Denied A/V hallucinations.  Denied pain.  Suicide prevention information given and discussed with patient who stated she understood and had no questions.  Patient stated she received all her belongings, clothing, toiletries, misc items, prescriptions, notebook, headphones, pen, etc.  Patient stated she appreciated all assistance received from [facility].

## 2015-05-15 NOTE — Discharge Summary (Signed)
Physician Discharge Summary Note  Patient:  Kaitlyn Larson is an 19 y.o., female MRN:  [id number] DOB:  [birth year] Patient phone:  [phone] (home)  Patient address:   [address] [place] [address],  Total Time spent with patient: 30 minutes  Date of Admission:  05/11/2015 Date of Discharge: 10//19/2016  Reason for Admission:  Depression  Principal Problem: MDD (major depressive disorder), recurrent episode, severe [facility]) Discharge Diagnoses: Patient Active Problem List   Diagnosis Date Noted  . PTSD (post-traumatic stress disorder) [F43.10] 05/12/2015  . MDD (major depressive disorder), recurrent episode, severe (HCC) [F33.2] 05/11/2015    Musculoskeletal: Strength & Muscle Tone: within normal limits Gait & Station: normal Patient leans: N/A  Psychiatric Specialty Exam:  SEE MD SRA Physical Exam  Vitals reviewed. Psychiatric: Thought content is not paranoid. She does not exhibit a depressed mood. She expresses no homicidal and no suicidal ideation.    Review of Systems  All other systems reviewed and are negative.   Blood pressure 96/66, pulse 82, temperature 98 F (36.7 C), temperature source Oral, resp. rate 16, height  (1.575 m), weight 56.7 kg (125 lb), last menstrual period 04/27/2015.Body mass index is 22.86 kg/(m^2).  Have you used any form of tobacco in the last 30 days? (Cigarettes, Smokeless Tobacco, Cigars, and/or Pipes): Yes  Has this patient used any form of tobacco in the last 30 days? (Cigarettes, Smokeless Tobacco, Cigars, and/or Pipes) N/A  Past Medical History:  Past Medical History  Diagnosis Date  . Anxiety   . Depression     Past Surgical History  Procedure Laterality Date  . Appendectomy     Family History: History reviewed. No pertinent family history. Social History:  History  Alcohol Use No     History  Drug Use  . Yes  . Special: Marijuana    Social History   Social History  . Marital Status: Married    Spouse Name:  N/A  . Number of Children: N/A  . Years of Education: N/A   Social History Main Topics  . Smoking status: Current Every Day Smoker    Types: Cigarettes  . Smokeless tobacco: None  . Alcohol Use: No  . Drug Use: Yes    Special: Marijuana  . Sexual Activity: Yes    Birth Control/ Protection: None   Other Topics Concern  . None   Social History Narrative    Past Psychiatric History: Hospitalizations:  Outpatient Care:  Substance Abuse Care:  Self-Mutilation:  Suicidal Attempts:  Violent Behaviors:   Risk to Self: Is patient at risk for suicide?: Yes What has been your use of drugs/alcohol within the last 12 months?: marijuana use - daily or every other day Risk to Others:   Prior Inpatient Therapy:   Prior Outpatient Therapy:    Level of Care:  OP  Hospital Course:  Tenaya FORRESTINE LECRONE was admitted for MDD (major depressive disorder), recurrent episode, severe (HCC) and crisis management.  She was treated discharged with the medications listed below under Medication List.  Medical problems were identified and treated as needed.  Home medications were restarted as appropriate.  Improvement was monitored by observation and Rolinda Roan daily report of symptom reduction.  Emotional and mental status was monitored by daily self-inventory reports completed by Rolinda Roan and clinical staff.         Janeka WILLYE JAVIER was evaluated by the treatment team for stability and plans for continued recovery upon discharge.  Davetta Sherrilyn Rist  motivation was an integral factor for scheduling further treatment.  Employment, transportation, bed availability, health status, family support, and any pending legal issues were also considered during her hospital stay.  She was offered further treatment options upon discharge including but not limited to Residential, Intensive Outpatient, and Outpatient treatment.  Laykin Sherrilyn RistJ Ambs will follow up with the services as listed below under Follow  Up Information.     Upon completion of this admission the patient was both mentally and medically stable for discharge denying suicidal/homicidal ideation, auditory/visual/tactile hallucinations, delusional thoughts and paranoia.      Consults:  psychiatry  Significant Diagnostic Studies:  labs: per ED  Discharge Vitals:   Blood pressure 96/66, pulse 82, temperature 98 F (36.7 C), temperature source Oral, resp. rate 16, height 5\' 2"  (1.575 m), weight 56.7 kg (125 lb), last menstrual period 04/27/2015. Body mass index is 22.86 kg/(m^2). Lab Results:   No results found for this or any previous visit (from the past 72 hour(s)).  Physical Findings: AIMS: Facial and Oral Movements Muscles of Facial Expression: None, normal Lips and Perioral Area: None, normal Jaw: None, normal Tongue: None, normal,Extremity Movements Upper (arms, wrists, hands, fingers): None, normal Lower (legs, knees, ankles, toes): None, normal, Trunk Movements Neck, shoulders, hips: None, normal, Overall Severity Severity of abnormal movements (highest score from questions above): None, normal Incapacitation due to abnormal movements: None, normal Patient's awareness of abnormal movements (rate only patient's report): No Awareness, Dental Status Current problems with teeth and/or dentures?: No Does patient usually wear dentures?: No  CIWA:    COWS:      See Psychiatric Specialty Exam and Suicide Risk Assessment completed by Attending Physician prior to discharge.  Discharge destination:  Home  Is patient on multiple antipsychotic therapies at discharge:  No   Has Patient had three or more failed trials of antipsychotic monotherapy by history:  No    Recommended Plan for Multiple Antipsychotic Therapies: NA     Medication List    TAKE these medications      Indication   escitalopram 10 MG tablet  Commonly known as:  LEXAPRO  Take 1 tablet (10 mg total) by mouth daily.   Indication:  Generalized  Anxiety Disorder           Follow-up Information    Follow up with [facility].   Why:  Walk in between 8am-9am Monday through Friday for hospital follow-up/medication management/assessment for mental health services.    Contact information:   [address]  [place], [place] [address] Phone: [phone] Fax: [phone]      Follow up with [facility].   Contact information:   [facility] [address] [place], [place] [address] Phone: [phone] Fax: [phone]      Follow-up recommendations:  Activity:  as tol Diet:  as tol  Comments:  1.  Take all your medications as prescribed.              2.  Report any adverse side effects to outpatient provider.                       3.  Patient instructed to not use alcohol or illegal drugs while on prescription medicines.            4.  In the event of worsening symptoms, instructed patient to call 911, the crisis hotline or go to nearest emergency room for evaluation of symptoms.  Total Discharge Time: 30 min  Signed: Velna Hatchet May Agustin AGNP-BC 05/15/2015, 10:04 AM  I personally assessed the patient and formulated the plan Madie Reno A. Dub Mikes, M.D.
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
## 2015-05-15 NOTE — Progress Notes (Signed)
D:  Patient's self inventory sheet, patient has poor sleep, sleep medication was not helpful.  Good appetite, hyper energy level, poor concentration.  Denied depression and anxiety.  Denied withdrawals.  Denied physical problems.  Denied pain.  Goal is discharge.  Does have discharge plans. A:  Medications administered per MD orders.  Emotional support and encouragement given patient. R:  Denied SI and HI, contracts for safety.  Denied A/V hallucinations.  Safety maintained with 15 minute checks.

## 2015-05-15 NOTE — BHH Suicide Risk Assessment (Signed)
B[facility] INPATIENT:  Family/Significant Other Suicide Prevention Education  Suicide Prevention Education:  Education Completed; Alanda AmassChastity Gago (pt's mother) [phone] has been identified by the patient as the family member/significant other with whom the patient will be residing, and identified as the person(s) who will aid the patient in the event of a mental health crisis (suicidal ideations/suicide attempt).  With written consent from the patient, the family member/significant other has been provided the following suicide prevention education, prior to the and/or following the discharge of the patient.  The suicide prevention education provided includes the following:  Suicide risk factors  Suicide prevention and interventions  National Suicide Hotline telephone number  [facility] assessment telephone number  [facility] Emergency Assistance 911  [facility] and/or Residential Mobile Crisis Unit telephone number  Request made of family/significant other to:  Remove weapons (e.g., guns, rifles, knives), all items previously/currently identified as safety concern.    Remove drugs/medications (over-the-counter, prescriptions, illicit drugs), all items previously/currently identified as a safety concern.  The family member/significant other verbalizes understanding of the suicide prevention education information provided.  The family member/significant other agrees to remove the items of safety concern listed above.  Smart, Katrenia Alkins LCSWA 05/15/2015, 10:50 AM

## 2015-05-15 NOTE — Progress Notes (Signed)
D: Patient alert and oriented x 4. Patient denies SI/HI/AVH. Patient complained of a headache 8/10. PRN dose of Ibuprofen was given with effective results. Patient states, "I was having a good day but earlier I had a anxiety attack and I don't know what caused it." Patient also spoke about her husband and how he does not support her when she needs it. This Clinical research associatewriter offered emotional support.  A: Staff to monitor Q 15 mins for safety. Encouragement and support offered. Scheduled medications administered per orders. R: Patient remains safe on the unit. Patient attended group tonight. Patient visible on hte unit and interacting with peers. Patient taking administered medications.
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
## 2015-05-15 NOTE — Progress Notes (Deleted)
  [facility] :  Will you be returning to the same living situation after discharge:  Yes,  home with her mother At discharge, do you have transportation home?: Yes,  mother or husband Do you have the ability to pay for your medications: Yes,  [facility] Medicaid  Release of information consent forms completed and submitted to medical records by CSW.   Patient to Follow up at: Follow-up Information    Follow up with [facility].   Why:  Walk in between 8am-9am Monday through Friday for hospital follow-up/medication management/assessment for mental health services.    Contact information:   [address]  [place], [place] [address] Phone: [phone] Fax: [phone]      Follow up with [facility] On 05/23/2015.   Why:  Appt on this date at 3:00PM with Rudi RummageSharon Burkett for counseling. Please bring Medicaid card to this appt. Please call to cancel/reschedule within 48 hours of appt if necessary.    Contact information:   [facility] [address] [place], [place] [address] Phone: [phone] Fax: [phone]      Patient denies SI/HI: Yes,  during group/self report.     Safety Planning and Suicide Prevention discussed: Yes,  SPE completed with pt's mother. SPI pamphlet provided to pt and she was encouraged to share information with support network, ask questions, and talk about any concerns relating to SPE.  Have you used any form of tobacco in the last 30 days? (Cigarettes, Smokeless Tobacco, Cigars, and/or Pipes): Yes  Has patient been referred to the Quitline?: Yes, faxed on 05/15/15  Smart, Raphaella Larkin LCSWA  05/15/2015, 10:54 AM

## 2016-09-23 ENCOUNTER — Encounter (HOSPITAL_COMMUNITY): Payer: Self-pay | Admitting: Emergency Medicine

## 2016-09-23 ENCOUNTER — Emergency Department (HOSPITAL_COMMUNITY)
Admission: EM | Admit: 2016-09-23 | Discharge: 2016-09-23 | Disposition: A | Discharge status: Home / Self Care | Payer: Medicaid Other | Attending: Emergency Medicine | Admitting: Emergency Medicine

## 2016-09-23 ENCOUNTER — Emergency Department (HOSPITAL_COMMUNITY): Payer: Medicaid Other

## 2016-09-23 DIAGNOSIS — F17210 Nicotine dependence, cigarettes, uncomplicated: Secondary | ICD-10-CM | POA: Insufficient documentation

## 2016-09-23 DIAGNOSIS — R197 Diarrhea, unspecified: Secondary | ICD-10-CM | POA: Diagnosis not present

## 2016-09-23 DIAGNOSIS — R1030 Lower abdominal pain, unspecified: Secondary | ICD-10-CM | POA: Diagnosis not present

## 2016-09-23 DIAGNOSIS — R112 Nausea with vomiting, unspecified: Secondary | ICD-10-CM | POA: Diagnosis not present

## 2016-09-23 DIAGNOSIS — R1010 Upper abdominal pain, unspecified: Secondary | ICD-10-CM | POA: Diagnosis present

## 2016-09-23 LAB — CBC
HCT: 37.8 % (ref 36.0–46.0)
HEMOGLOBIN: 13.5 g/dL (ref 12.0–15.0)
MCH: 31.9 pg (ref 26.0–34.0)
MCHC: 35.7 g/dL (ref 30.0–36.0)
MCV: 89.4 fL (ref 78.0–100.0)
Platelets: 244 10*3/uL (ref 150–400)
RBC: 4.23 MIL/uL (ref 3.87–5.11)
RDW: 14.2 % (ref 11.5–15.5)
WBC: 9.9 10*3/uL (ref 4.0–10.5)

## 2016-09-23 LAB — COMPREHENSIVE METABOLIC PANEL
ALBUMIN: 4.3 g/dL (ref 3.5–5.0)
ALK PHOS: 50 U/L (ref 38–126)
ALT: 10 U/L — AB (ref 14–54)
ANION GAP: 9 (ref 5–15)
AST: 17 U/L (ref 15–41)
BUN: 12 mg/dL (ref 6–20)
CALCIUM: 9.6 mg/dL (ref 8.9–10.3)
CHLORIDE: 107 mmol/L (ref 101–111)
CO2: 24 mmol/L (ref 22–32)
CREATININE: 0.6 mg/dL (ref 0.44–1.00)
GFR calc non Af Amer: >60 mL/min (ref >60)
GLUCOSE: 96 mg/dL (ref 65–99)
Potassium: 4.3 mmol/L (ref 3.5–5.1)
SODIUM: 140 mmol/L (ref 135–145)
Total Bilirubin: 0.6 mg/dL (ref 0.3–1.2)
Total Protein: 7 g/dL (ref 6.5–8.1)

## 2016-09-23 LAB — WET PREP, GENITAL
Clue Cells Wet Prep HPF POC: NONE SEEN
SPERM: NONE SEEN
TRICH WET PREP: NONE SEEN
WBC, Wet Prep HPF POC: NONE SEEN
YEAST WET PREP: NONE SEEN

## 2016-09-23 LAB — URINALYSIS, ROUTINE W REFLEX MICROSCOPIC
Bilirubin Urine: NEGATIVE
Glucose, UA: NEGATIVE mg/dL
Hgb urine dipstick: NEGATIVE
Ketones, ur: 5 mg/dL — AB
Leukocytes, UA: NEGATIVE
Nitrite: NEGATIVE
PROTEIN: NEGATIVE mg/dL
SPECIFIC GRAVITY, URINE: 1.026 (ref 1.005–1.030)
WBC UA: NONE SEEN WBC/hpf (ref 0–5)
pH: 6 (ref 5.0–8.0)

## 2016-09-23 LAB — I-STAT BETA HCG BLOOD, ED (MC, WL, AP ONLY)

## 2016-09-23 LAB — I-STAT CG4 LACTIC ACID, ED: Lactic Acid, Venous: 0.63 mmol/L (ref 0.5–1.9)

## 2016-09-23 LAB — LIPASE, BLOOD: LIPASE: 21 U/L (ref 11–51)

## 2016-09-23 MED ORDER — ONDANSETRON 4 MG PO TBDP
4.0000 mg | ORAL_TABLET | Freq: Once | ORAL | Status: AC | PRN
  Administered 2016-09-23: 4 mg via ORAL

## 2016-09-23 MED ORDER — ONDANSETRON HCL 4 MG PO TABS: 4.0000 mg | ORAL_TABLET | Freq: Three times a day (TID) | ORAL | 0 refills | Status: DC | PRN

## 2016-09-23 MED ORDER — ONDANSETRON 4 MG PO TBDP
ORAL_TABLET | ORAL | Status: AC
  Administered 2016-09-23: 4 mg via ORAL
  Filled 2016-09-23: qty 1

## 2016-09-23 MED ORDER — ONDANSETRON HCL 4 MG/2ML IJ SOLN
4.0000 mg | Freq: Once | INTRAMUSCULAR | Status: AC
  Administered 2016-09-23: 4 mg via INTRAVENOUS
  Filled 2016-09-23: qty 2

## 2016-09-23 MED ORDER — MORPHINE SULFATE (PF) 4 MG/ML IV SOLN
4.0000 mg | Freq: Once | INTRAVENOUS | Status: AC
  Administered 2016-09-23: 4 mg via INTRAVENOUS
  Filled 2016-09-23: qty 1

## 2016-09-23 MED ORDER — SODIUM CHLORIDE 0.9 % IV BOLUS (SEPSIS)
1000.0000 mL | Freq: Once | INTRAVENOUS | Status: AC
  Administered 2016-09-23: 1000 mL via INTRAVENOUS

## 2016-09-23 MED ORDER — OXYCODONE-ACETAMINOPHEN 5-325 MG PO TABS: 1.0000 | ORAL_TABLET | Freq: Four times a day (QID) | ORAL | 0 refills | Status: DC | PRN

## 2016-09-23 NOTE — ED Notes (Signed)
Pt on cell phone during assessment.  C/o abd pain for 2 weeks with nausea and vomiting  No bm for 4-5 days  She was seen at [place] ed last pm for the same

## 2016-09-23 NOTE — ED Notes (Signed)
Patient transported to Ultrasound 

## 2016-09-23 NOTE — ED Notes (Signed)
The pts pain is almost gone 

## 2016-09-23 NOTE — ED Provider Notes (Signed)
[facility]-EMERGENCY DEPT Provider Note   CSN: [id number] Arrival date & time: 09/23/16  1338     History   Chief Complaint Chief Complaint  Patient presents with  . Abdominal Pain    HPI Kaitlyn Larson is a 21 y.o. female with a past medical history significant for depression and prior appendectomy who presents with a three-week history of chills, nausea, vomiting, abdominal pain, and increasing urination. Patient reports that she was totally normal until the last 3 weeks. She says that she begin developing pain all across her lower abdomen. She says she is having no upper abdominal pain. She says it feels similar to when she had appendicitis pain years ago. Patient says that the pain is a 10 out of 10, is constant, and is worsened with attempting to eat or move. She reports that she has had worsening of the pain in the last 3 days. She says she has had no oral intake during that time because anything she eats or drinks, Lurena Joiner. She reports subjective fevers and chills. She does report that her urine has increased but denies dysuria or hematuria. She reports diarrhea over the last 3 days but denies any bleeding. She denies any vaginal discharge or vaginal bleeding and history of STI's. She does report that she went to an outside facility several days ago and had a CT scan that she was told "look normal". She reports that she was diagnosed with food poisoning and given Cipro and full tearing.  Due to continued symptoms and feeling dehydrated due to not tolerating fluids, patient presents for further evaluation.  Of note, patient reports that she has not had a menstrual cycle in several years due to Depo shots however, she did not have a shot in December. She denies any recent abdominal traumas. She reports the pain radiates around towards her back. She denies history of kidney stones but reports a family history of both pancreatitis and gallbladder disease.    HPI  Past Medical History:    Diagnosis Date  . Anxiety   . Depression     Patient Active Problem List   Diagnosis Date Noted  . Severe episode of recurrent major depressive disorder, without psychotic features (HCC)   . PTSD (post-traumatic stress disorder) 05/12/2015  . MDD (major depressive disorder), recurrent episode, severe (HCC) 05/11/2015    Past Surgical History:  Procedure Laterality Date  . APPENDECTOMY      OB History    No data available       Home Medications    Prior to Admission medications   Medication Sig Start Date End Date Taking? Authorizing Provider  escitalopram (LEXAPRO) 10 MG tablet Take 1 tablet (10 mg total) by mouth daily. 05/15/15   Adonis Brook, NP  hydrOXYzine (ATARAX/VISTARIL) 25 MG tablet Take 1 tablet (25 mg total) by mouth every 6 (six) hours as needed for anxiety. 05/15/15   Adonis Brook, NP  traZODone (DESYREL) 50 MG tablet Take 1 tablet (50 mg total) by mouth at bedtime. 05/15/15   Adonis Brook, NP    Family History History reviewed. No pertinent family history.  Social History Social History  Substance Use Topics  . Smoking status: Current Every Day Smoker    Types: Cigarettes  . Smokeless tobacco: Not on file  . Alcohol use No     Allergies   Lactose intolerance (gi)   Review of Systems Review of Systems  Constitutional: Positive for chills and fever. Negative for appetite change, diaphoresis and fatigue.  HENT: Negative for congestion and rhinorrhea.   Eyes: Negative for visual disturbance.  Respiratory: Negative for cough, chest tightness, shortness of breath, wheezing and stridor.   Cardiovascular: Negative for chest pain, palpitations and leg swelling.  Gastrointestinal: Positive for abdominal pain, diarrhea, nausea and vomiting. Negative for abdominal distention, blood in stool and constipation.  Endocrine: Positive for polyuria.  Genitourinary: Negative for decreased urine volume (increased urination), dysuria, flank pain, vaginal  bleeding, vaginal discharge and vaginal pain.  Musculoskeletal: Negative for back pain, neck pain and neck stiffness.  Skin: Negative for rash.  Neurological: Negative for light-headedness and headaches.  Psychiatric/Behavioral: Negative for agitation.  All other systems reviewed and are negative.    Physical Exam Updated Vital Signs BP (!) 96/51 (BP Location: Right Arm)   Pulse 80   Temp 98.5 F (36.9 C) (Oral)   Resp 16   SpO2 98%   Physical Exam  Constitutional: She is oriented to person, place, and time. She appears well-developed and well-nourished. No distress.  HENT:  Head: Normocephalic and atraumatic.  Right Ear: External ear normal.  Left Ear: External ear normal.  Nose: Nose normal.  Mouth/Throat: Oropharynx is clear and moist. No oropharyngeal exudate.  Eyes: Conjunctivae and EOM are normal. Pupils are equal, round, and reactive to light.  Neck: Normal range of motion. Neck supple.  Cardiovascular: Normal rate, normal heart sounds and intact distal pulses.   No murmur heard. Pulmonary/Chest: Effort normal and breath sounds normal. No stridor. No respiratory distress. She has no wheezes. She exhibits no tenderness.  Abdominal: Soft. Bowel sounds are normal. She exhibits distension. There is tenderness in the right lower quadrant, suprapubic area and left lower quadrant. There is no rigidity, no rebound, no guarding and no CVA tenderness.    Musculoskeletal: She exhibits tenderness.       Lumbar back: She exhibits tenderness.       Back:  Neurological: She is alert and oriented to person, place, and time. She has normal reflexes. No sensory deficit. She exhibits normal muscle tone.  Skin: Skin is warm. Capillary refill takes less than 2 seconds. No rash noted. She is not diaphoretic. No erythema.  Psychiatric: She has a normal mood and affect.  Nursing note and vitals reviewed.    ED Treatments / Results  Labs (all labs ordered are listed, but only abnormal  results are displayed) Labs Reviewed  COMPREHENSIVE METABOLIC PANEL - Abnormal; Notable for the following:       Result Value   ALT 10 (*)    All other components within normal limits  URINALYSIS, ROUTINE W REFLEX MICROSCOPIC - Abnormal; Notable for the following:    APPearance HAZY (*)    Ketones, ur 5 (*)    Bacteria, UA RARE (*)    Squamous Epithelial / LPF 6-30 (*)    All other components within normal limits  WET PREP, GENITAL  LIPASE, BLOOD  CBC  I-STAT BETA HCG BLOOD, ED ([facility], [facility], [facility] ONLY)  I-STAT CG4 LACTIC ACID, ED  I-STAT CG4 LACTIC ACID, ED  GC/CHLAMYDIA PROBE AMP ([place]) NOT AT [facility]  WET PREP  (BD AFFIRM) ([place])    EKG  EKG Interpretation None       Radiology US Transvaginal Non-ob  Result Date: 09/23/2016 CLINICAL DATA:  Lower abdominal pain. EXAM: TRANSABDOMINAL AND TRANSVAGINAL ULTRASOUND OF PELVIS DOPPLER ULTRASOUND OF OVARIES TECHNIQUE: Both transabdominal and transvaginal ultrasound examinations of the pelvis were performed. Transabdominal technique was performed for global imaging of the pelvis  including uterus, ovaries, adnexal regions, and pelvic cul-de-sac. It was necessary to proceed with endovaginal exam following the transabdominal exam to visualize the uterus and adnexa. Color and duplex Doppler ultrasound was utilized to evaluate blood flow to the ovaries. COMPARISON:  CT abdomen/ pelvis 2 days prior 09/21/2016 FINDINGS: Uterus Measurements: 7.4 x 4.6 x 5.9 cm. No fibroids or other mass visualized. Endometrium Thickness: 2.4 mm. No focal abnormality visualized. Trace fluid in the endometrial canal is physiologic. Right ovary Measurements: 4.3 x 2.3 x 2.8 cm. A follicular cyst measures 2.7 cm in greatest dimension. There is normal blood flow. Left ovary Measurements: 2.6 x 1.9 x 1.4 cm. Normal appearance/no adnexal mass. Normal blood flow. Pulsed Doppler evaluation of both ovaries demonstrates normal low-resistance arterial and venous waveforms.  Other findings No abnormal free fluid. IMPRESSION: 1. Follicular cyst in the right ovary measures 2.7 cm, no evidence of torsion. This is likely physiologic and no dedicated imaging follow-up is needed. 2. Normal sonographic appearance of the uterus and left ovary. No adnexal mass. Electronically Signed   By: Rubye Oaks M.D.   On: 09/23/2016 20:28   US Pelvis Complete  Result Date: 09/23/2016 CLINICAL DATA:  Lower abdominal pain. EXAM: TRANSABDOMINAL AND TRANSVAGINAL ULTRASOUND OF PELVIS DOPPLER ULTRASOUND OF OVARIES TECHNIQUE: Both transabdominal and transvaginal ultrasound examinations of the pelvis were performed. Transabdominal technique was performed for global imaging of the pelvis including uterus, ovaries, adnexal regions, and pelvic cul-de-sac. It was necessary to proceed with endovaginal exam following the transabdominal exam to visualize the uterus and adnexa. Color and duplex Doppler ultrasound was utilized to evaluate blood flow to the ovaries. COMPARISON:  CT abdomen/ pelvis 2 days prior 09/21/2016 FINDINGS: Uterus Measurements: 7.4 x 4.6 x 5.9 cm. No fibroids or other mass visualized. Endometrium Thickness: 2.4 mm. No focal abnormality visualized. Trace fluid in the endometrial canal is physiologic. Right ovary Measurements: 4.3 x 2.3 x 2.8 cm. A follicular cyst measures 2.7 cm in greatest dimension. There is normal blood flow. Left ovary Measurements: 2.6 x 1.9 x 1.4 cm. Normal appearance/no adnexal mass. Normal blood flow. Pulsed Doppler evaluation of both ovaries demonstrates normal low-resistance arterial and venous waveforms. Other findings No abnormal free fluid. IMPRESSION: 1. Follicular cyst in the right ovary measures 2.7 cm, no evidence of torsion. This is likely physiologic and no dedicated imaging follow-up is needed. 2. Normal sonographic appearance of the uterus and left ovary. No adnexal mass. Electronically Signed   By: Rubye Oaks M.D.   On: 09/23/2016 20:28   [place]  Art/ven Flow Abd Pelv Doppler  Result Date: 09/23/2016 CLINICAL DATA:  Lower abdominal pain. EXAM: TRANSABDOMINAL AND TRANSVAGINAL ULTRASOUND OF PELVIS DOPPLER ULTRASOUND OF OVARIES TECHNIQUE: Both transabdominal and transvaginal ultrasound examinations of the pelvis were performed. Transabdominal technique was performed for global imaging of the pelvis including uterus, ovaries, adnexal regions, and pelvic cul-de-sac. It was necessary to proceed with endovaginal exam following the transabdominal exam to visualize the uterus and adnexa. Color and duplex Doppler ultrasound was utilized to evaluate blood flow to the ovaries. COMPARISON:  CT abdomen/ pelvis 2 days prior 09/21/2016 FINDINGS: Uterus Measurements: 7.4 x 4.6 x 5.9 cm. No fibroids or other mass visualized. Endometrium Thickness: 2.4 mm. No focal abnormality visualized. Trace fluid in the endometrial canal is physiologic. Right ovary Measurements: 4.3 x 2.3 x 2.8 cm. A follicular cyst measures 2.7 cm in greatest dimension. There is normal blood flow. Left ovary Measurements: 2.6 x 1.9 x 1.4 cm. Normal appearance/no adnexal mass. Normal  blood flow. Pulsed Doppler evaluation of both ovaries demonstrates normal low-resistance arterial and venous waveforms. Other findings No abnormal free fluid. IMPRESSION: 1. Follicular cyst in the right ovary measures 2.7 cm, no evidence of torsion. This is likely physiologic and no dedicated imaging follow-up is needed. 2. Normal sonographic appearance of the uterus and left ovary. No adnexal mass. Electronically Signed   By: Rubye OaksMelanie  Ehinger M.D.   On: 09/23/2016 20:28    Procedures Procedures (including critical care time)  Medications Ordered in ED Medications  ondansetron (ZOFRAN-ODT) disintegrating tablet 4 mg (4 mg Oral Given 09/23/16 1355)  morphine 4 MG/ML injection 4 mg (4 mg Intravenous Given 09/23/16 1733)  ondansetron (ZOFRAN) injection 4 mg (4 mg Intravenous Given 09/23/16 1730)  sodium chloride 0.9 %  bolus 1,000 mL (0 mLs Intravenous Stopped 09/23/16 2251)  morphine 4 MG/ML injection 4 mg (4 mg Intravenous Given 09/23/16 2130)  ondansetron (ZOFRAN) injection 4 mg (4 mg Intravenous Given 09/23/16 2209)     Initial Impression / Assessment and Plan / ED Course  I have reviewed the triage vital signs and the nursing notes.  Pertinent labs & imaging results that were available during my care of the patient were reviewed by me and considered in my medical decision making (see chart for details).     Afreen Sherrilyn RistJ Odor is a 21 y.o. female with a past medical history significant for depression and prior appendectomy who presents with a three-week history of chills, nausea, vomiting, abdominal pain, and increasing urination.  History and exam are seen above. On exam, patient has tenderness across the lower abdomen. Patient has tenderness in the bilateral flanks. Patient's lungs are clear and chest is nontender. Patient's electronic exams are unremarkable. Patient has no focal neurologic deficits. Patient's upper abdomen is nontender.  Patient will have a pelvic exam and pelvic swabs sent.  Patient left for testing and pain medicine, nausea, and fluids administered for symptoms.  Patient will also have pelvic ultrasound performed to determine etiology of discomfort.  Pelvic exam was performed and revealed no cervical motion tenderness. No adnexal tenderness. Swabs were collected.  Diagnostic workup results are seen above.   Revealed no abnormalities. Patient is not pregnant. Urinalysis shows no UTI. Lactic acid normal. CMP grossly unremarkable and lipase not elevated. ABC shows no leukocytosis or anemia.  Ultrasound Shows follicular cyst on the right ovary but otherwise no significant abnormalities and no abscesses.  Given lack of cervical motion tenderness and reassuring lab testing, do not feel patient has PID. Patient had GC and Chlamydia swabs and, if these are positive, patient will be  contacted. Patient felt better after pain medicine and nausea medicine. Suspect patient has GI tract irritation due to the diarrhea she is currently having a do not feel patient has any findings that require admission or further management in the emergency room. Patient informed that although her etiology of abdominal pain was not determined tonight, she will need to follow-up with a primary doctor the next several days for an abdominal recheck. Has patient had a reportedly reassuring CT scan several days ago and given the lack of abdominal pain in the upper abdomen, do not feel patient needs another CT scan tonight. Based on exam, do not feel patient has diverticulitis. Patient has no appendix.   Patient given return precautions for any new or worsening symptoms and she was told that if she needs to return, she may need to have another CT scan. Patient understood return precautions as well as  fall instructions. Patient had no other questions or concerns and patient was discharged in good condition.    Final Clinical Impressions(s) / ED Diagnoses   Final diagnoses:  Lower abdominal pain  Non-intractable vomiting with nausea, unspecified vomiting type  Diarrhea, unspecified type    New Prescriptions Discharge Medication List as of 09/23/2016 10:43 PM    START taking these medications   Details  ondansetron (ZOFRAN) 4 MG tablet Take 1 tablet (4 mg total) by mouth every 8 (eight) hours as needed for nausea or vomiting., Starting Wed 09/23/2016, Print    oxyCODONE-acetaminophen (PERCOCET/ROXICET) 5-325 MG tablet Take 1 tablet by mouth every 6 (six) hours as needed for severe pain., Starting Wed 09/23/2016, Print        Clinical Impression: 1. Non-intractable vomiting with nausea, unspecified vomiting type   2. Lower abdominal pain   3. Diarrhea, unspecified type     Disposition: Discharge  Condition: Good  I have discussed the results, Dx and Tx plan with the pt(& family if present).  He/she/they expressed understanding and agree(s) with the plan. Discharge instructions discussed at great length. Strict return precautions discussed and pt &/or family have verbalized understanding of the instructions. No further questions at time of discharge.    Discharge Medication List as of 09/23/2016 10:43 PM    START taking these medications   Details  ondansetron (ZOFRAN) 4 MG tablet Take 1 tablet (4 mg total) by mouth every 8 (eight) hours as needed for nausea or vomiting., Starting Wed 09/23/2016, Print    oxyCODONE-acetaminophen (PERCOCET/ROXICET) 5-325 MG tablet Take 1 tablet by mouth every 6 (six) hours as needed for severe pain., Starting Wed 09/23/2016, Print        Follow Up: [facility] [address] [place] [address] [phone] In 3 days   [facility] EMERGENCY DEPARTMENT [address] [id number] mc [place] [address] [phone]  If symptoms worsen     Heide Scales, MD 09/23/16 2340

## 2016-09-23 NOTE — Discharge Instructions (Signed)
Please use for pain medicine and nausea medicine as needed to help treat your symptoms. Please try and stay hydrated. Please follow-up with a primary care physician in the next few days for a recheck of your abdomen. If any symptoms develop or worsen that are new, please return to the nearest emergency department.

## 2016-09-23 NOTE — ED Notes (Signed)
Pt tolerating po fluids and crackers.

## 2016-09-23 NOTE — ED Triage Notes (Signed)
Pt sts lower abd pain into back; pt sts missed depo shot in December; [facility]

## 2016-09-24 LAB — GC/CHLAMYDIA PROBE AMP (~~LOC~~) NOT AT ARMC
Chlamydia: NEGATIVE
Neisseria Gonorrhea: NEGATIVE

## 2017-08-04 LAB — OB RESULTS CONSOLE GC/CHLAMYDIA
CHLAMYDIA, DNA PROBE: NEGATIVE
Gonorrhea: NEGATIVE

## 2017-08-04 LAB — OB RESULTS CONSOLE HEPATITIS B SURFACE ANTIGEN: HEP B S AG: NEGATIVE

## 2017-08-04 LAB — OB RESULTS CONSOLE HIV ANTIBODY (ROUTINE TESTING): HIV: NONREACTIVE

## 2017-08-04 LAB — OB RESULTS CONSOLE ABO/RH: RH Type: POSITIVE

## 2017-08-04 LAB — OB RESULTS CONSOLE RPR: RPR: NONREACTIVE

## 2017-08-04 LAB — OB RESULTS CONSOLE RUBELLA ANTIBODY, IGM: RUBELLA: IMMUNE

## 2017-08-04 LAB — OB RESULTS CONSOLE ANTIBODY SCREEN: ANTIBODY SCREEN: NEGATIVE

## 2017-08-13 ENCOUNTER — Other Ambulatory Visit: Payer: Self-pay

## 2017-10-04 ENCOUNTER — Inpatient Hospital Stay (HOSPITAL_COMMUNITY)
Admission: AD | Admit: 2017-10-04 | Discharge: 2017-10-04 | Disposition: A | Discharge status: Home / Self Care | Payer: Self-pay | Source: Ambulatory Visit | Attending: Obstetrics and Gynecology | Admitting: Obstetrics and Gynecology

## 2017-10-04 ENCOUNTER — Encounter (HOSPITAL_COMMUNITY): Payer: Self-pay | Admitting: *Deleted

## 2017-10-04 DIAGNOSIS — Z79899 Other long term (current) drug therapy: Secondary | ICD-10-CM | POA: Insufficient documentation

## 2017-10-04 DIAGNOSIS — Z3A20 20 weeks gestation of pregnancy: Secondary | ICD-10-CM | POA: Insufficient documentation

## 2017-10-04 DIAGNOSIS — R1030 Lower abdominal pain, unspecified: Secondary | ICD-10-CM | POA: Diagnosis present

## 2017-10-04 DIAGNOSIS — O99342 Other mental disorders complicating pregnancy, second trimester: Secondary | ICD-10-CM | POA: Insufficient documentation

## 2017-10-04 DIAGNOSIS — F419 Anxiety disorder, unspecified: Secondary | ICD-10-CM | POA: Insufficient documentation

## 2017-10-04 DIAGNOSIS — R102 Pelvic and perineal pain: Secondary | ICD-10-CM | POA: Diagnosis present

## 2017-10-04 DIAGNOSIS — O219 Vomiting of pregnancy, unspecified: Secondary | ICD-10-CM | POA: Insufficient documentation

## 2017-10-04 DIAGNOSIS — Z9889 Other specified postprocedural states: Secondary | ICD-10-CM | POA: Insufficient documentation

## 2017-10-04 DIAGNOSIS — Z87891 Personal history of nicotine dependence: Secondary | ICD-10-CM | POA: Insufficient documentation

## 2017-10-04 DIAGNOSIS — O9989 Other specified diseases and conditions complicating pregnancy, childbirth and the puerperium: Secondary | ICD-10-CM | POA: Insufficient documentation

## 2017-10-04 DIAGNOSIS — O26892 Other specified pregnancy related conditions, second trimester: Secondary | ICD-10-CM | POA: Insufficient documentation

## 2017-10-04 DIAGNOSIS — E739 Lactose intolerance, unspecified: Secondary | ICD-10-CM | POA: Insufficient documentation

## 2017-10-04 DIAGNOSIS — Z79891 Long term (current) use of opiate analgesic: Secondary | ICD-10-CM | POA: Insufficient documentation

## 2017-10-04 DIAGNOSIS — N949 Unspecified condition associated with female genital organs and menstrual cycle: Secondary | ICD-10-CM | POA: Diagnosis present

## 2017-10-04 DIAGNOSIS — F329 Major depressive disorder, single episode, unspecified: Secondary | ICD-10-CM | POA: Insufficient documentation

## 2017-10-04 HISTORY — DX: Fracture of nasal bones, initial encounter for closed fracture: S02.2XXA

## 2017-10-04 LAB — URINALYSIS, ROUTINE W REFLEX MICROSCOPIC
BILIRUBIN URINE: NEGATIVE
Glucose, UA: NEGATIVE mg/dL
KETONES UR: NEGATIVE mg/dL
LEUKOCYTES UA: NEGATIVE
NITRITE: NEGATIVE
PROTEIN: 30 mg/dL — AB
Specific Gravity, Urine: 1.019 (ref 1.005–1.030)
pH: 5 (ref 5.0–8.0)

## 2017-10-04 MED ORDER — SODIUM CHLORIDE 0.9 % IV SOLN
8.0000 mg | Freq: Once | INTRAVENOUS | Status: AC
  Administered 2017-10-04: 8 mg via INTRAVENOUS
  Filled 2017-10-04: qty 4

## 2017-10-04 MED ORDER — DEXTROSE 5 % IN LACTATED RINGERS IV BOLUS
1000.0000 mL | Freq: Once | INTRAVENOUS | Status: AC
  Administered 2017-10-04: 1000 mL via INTRAVENOUS

## 2017-10-04 NOTE — MAU Note (Signed)
Patient was at work this morning and felt sudden abdominal and back pain after voiding.  The pain has been constant and radiates down legs.  Denies vaginal bleeding or LOF.

## 2017-10-04 NOTE — MAU Provider Note (Signed)
History     CSN: [id number]  Arrival date and time: 10/04/17 4098   First Provider Initiated Contact with Patient 10/04/17 [phone]      Chief Complaint  Patient presents with  . Abdominal Pain   HPI  Ms.  Kaitlyn Larson is a 22 y.o. year old G49P0101 female at [redacted]w[redacted]d weeks gestation who presents to MAU reporting lower abdominal cramping, pressure, N/V/D. She reports that she woke up at 0100 on Sunday morning with vomiting and diarrhea. She reports she was "sick all day yesterday". She was able to eat a bowl of cereal, 2 slices of pizza yesterday and drank water "constantly" yesterday. She went to work at 0500 this morning when she started having lots of abdominal cramping and "lots" of pressure. She reports there are lots of people at work with the stomach bug, but still coming to work.   Past Medical History:  Diagnosis Date  . Anxiety   . Broken nose   . Depression     Past Surgical History:  Procedure Laterality Date  . APPENDECTOMY      History reviewed. No pertinent family history.  Social History   Tobacco Use  . Smoking status: Former Smoker    Types: Cigarettes  . Smokeless tobacco: Never Used  Substance Use Topics  . Alcohol use: Yes    Comment: not while pregnant  . Drug use: No    Comment: in past    Allergies:  Allergies  Allergen Reactions  . Lactose Intolerance (Gi) Diarrhea and Other (See Comments)    Severe gas and bloating    Medications Prior to Admission  Medication Sig Dispense Refill Last Dose  . ciprofloxacin (CIPRO) 500 MG tablet Take 500 mg by mouth 2 (two) times daily.   09/23/2016 at Unknown time  . diclofenac (VOLTAREN) 50 MG EC tablet Take 50 mg by mouth 3 (three) times daily.   09/23/2016 at Unknown time  . ondansetron (ZOFRAN) 4 MG tablet Take 1 tablet (4 mg total) by mouth every 8 (eight) hours as needed for nausea or vomiting. 12 tablet 0   . oxyCODONE-acetaminophen (PERCOCET/ROXICET) 5-325 MG tablet Take 1 tablet by mouth every 6  (six) hours as needed for severe pain. 15 tablet 0     Review of Systems  Constitutional: Negative.   HENT: Negative.   Eyes: Negative.   Respiratory: Negative.   Cardiovascular: Negative.   Gastrointestinal: Positive for diarrhea (watery stools), nausea and vomiting.  Endocrine: Negative.   Genitourinary: Positive for pelvic pain (pressure).  Musculoskeletal: Positive for back pain.  Skin: Negative.   Allergic/Immunologic: Negative.   Neurological: Negative.   Hematological: Negative.   Psychiatric/Behavioral: Negative.    Physical Exam   Blood pressure (!) 92/48, pulse 76, temperature 98.5 F (36.9 C), temperature source Oral, resp. rate 18, height 5\' 2"  (1.575 m), weight 132 lb (59.9 kg), SpO2 98 %.  Physical Exam  Nursing note and vitals reviewed. Constitutional: She is oriented to person, place, and time. She appears well-developed and well-nourished.  HENT:  Head: Normocephalic and atraumatic.  Eyes: Pupils are equal, round, and reactive to light.  Neck: Normal range of motion.  Cardiovascular: Normal rate, regular rhythm and normal heart sounds.  Respiratory: Effort normal and breath sounds normal.  GI: Soft. Bowel sounds are normal.  Musculoskeletal: Normal range of motion.  Neurological: She is alert and oriented to person, place, and time.  Skin: Skin is warm and dry.  Psychiatric: She has a normal mood and affect.  Her behavior is normal. Judgment and thought content normal.   Dilation: Closed Effacement (%): Thick Cervical Position: Posterior Presentation: Undeterminable Exam by:: Carloyn Jaeger. Yakir Wenke, CNM  MAU Course  Procedures  MDM CCUA UCx -- pending Toco only: no UC's noted IVF: D5LR 1000 ml bolus Zofran 8 mg IVPB FHTs by doppler: 160 bpm    *Consult with Dr. Claiborne Billingsallahan @ 1123 - notified of patient's complaints, assessments, lab results, tx plan d/c home - ok to d/c home, agrees with plan  Results for orders placed or performed during the hospital  encounter of 10/04/17 (from the past 24 hour(s))  Urinalysis, Routine w reflex microscopic     Status: Abnormal   Collection Time: 10/04/17  8:06 AM  Result Value Ref Range   Color, Urine YELLOW YELLOW   APPearance CLOUDY (A) CLEAR   Specific Gravity, Urine 1.019 1.005 - 1.030   pH 5.0 5.0 - 8.0   Glucose, UA NEGATIVE NEGATIVE mg/dL   Hgb urine dipstick LARGE (A) NEGATIVE   Bilirubin Urine NEGATIVE NEGATIVE   Ketones, ur NEGATIVE NEGATIVE mg/dL   Protein, ur 30 (A) NEGATIVE mg/dL   Nitrite NEGATIVE NEGATIVE   Leukocytes, UA NEGATIVE NEGATIVE   RBC / HPF TOO NUMEROUS TO COUNT 0 - 5 RBC/hpf   WBC, UA 6-30 0 - 5 WBC/hpf   Bacteria, UA MANY (A) NONE SEEN   Squamous Epithelial / LPF 6-30 (A) NONE SEEN   Mucus PRESENT      Assessment and Plan  Lower abdominal pain  - Advised to take Tylenol 1000 mg every 6 hours prn pain - UCx results pending -- OB office will tx, if indicated  Pelvic pressure in pregnancy, antepartum, second trimester  - Reassurance given that cervix is closed - Advised to F/U with [facility] sooner, if sx's return  - Discharge patient - Keep scheduled OB appt with [facility] on 4/4 - Patient verbalized an understanding of the plan of care and agrees.     Raelyn Moraolitta Eulamae Greenstein, MSN, CNM 10/04/2017, 9:01 AM

## 2017-10-05 LAB — CULTURE, OB URINE
Culture: <10000 — AB
SPECIAL REQUESTS: NORMAL

## 2017-12-24 ENCOUNTER — Ambulatory Visit (HOSPITAL_COMMUNITY): Admission: RE | Admit: 2017-12-24 | Payer: BLUE CROSS/BLUE SHIELD | Source: Ambulatory Visit

## 2017-12-24 ENCOUNTER — Other Ambulatory Visit (HOSPITAL_COMMUNITY): Payer: Self-pay | Admitting: Obstetrics and Gynecology

## 2017-12-24 ENCOUNTER — Other Ambulatory Visit (HOSPITAL_COMMUNITY): Payer: Self-pay

## 2017-12-24 ENCOUNTER — Encounter (HOSPITAL_COMMUNITY): Payer: Self-pay

## 2017-12-24 ENCOUNTER — Other Ambulatory Visit (HOSPITAL_COMMUNITY): Payer: Self-pay | Admitting: *Deleted

## 2017-12-24 ENCOUNTER — Ambulatory Visit (HOSPITAL_COMMUNITY)
Admission: RE | Admit: 2017-12-24 | Discharge: 2017-12-24 | Disposition: A | Discharge status: Home / Self Care | Payer: BLUE CROSS/BLUE SHIELD | Source: Ambulatory Visit | Attending: Obstetrics and Gynecology | Admitting: Obstetrics and Gynecology

## 2017-12-24 DIAGNOSIS — O34219 Maternal care for unspecified type scar from previous cesarean delivery: Secondary | ICD-10-CM | POA: Insufficient documentation

## 2017-12-24 DIAGNOSIS — Z3A32 32 weeks gestation of pregnancy: Secondary | ICD-10-CM

## 2017-12-24 DIAGNOSIS — O09213 Supervision of pregnancy with history of pre-term labor, third trimester: Secondary | ICD-10-CM | POA: Insufficient documentation

## 2017-12-24 DIAGNOSIS — N949 Unspecified condition associated with female genital organs and menstrual cycle: Secondary | ICD-10-CM

## 2017-12-24 DIAGNOSIS — O365930 Maternal care for other known or suspected poor fetal growth, third trimester, not applicable or unspecified: Secondary | ICD-10-CM | POA: Insufficient documentation

## 2017-12-24 DIAGNOSIS — Z363 Encounter for antenatal screening for malformations: Secondary | ICD-10-CM | POA: Insufficient documentation

## 2017-12-24 DIAGNOSIS — Z8759 Personal history of other complications of pregnancy, childbirth and the puerperium: Secondary | ICD-10-CM

## 2017-12-24 DIAGNOSIS — O283 Abnormal ultrasonic finding on antenatal screening of mother: Secondary | ICD-10-CM | POA: Insufficient documentation

## 2017-12-24 DIAGNOSIS — R1030 Lower abdominal pain, unspecified: Secondary | ICD-10-CM

## 2017-12-24 DIAGNOSIS — O26892 Other specified pregnancy related conditions, second trimester: Secondary | ICD-10-CM

## 2017-12-24 DIAGNOSIS — O09293 Supervision of pregnancy with other poor reproductive or obstetric history, third trimester: Secondary | ICD-10-CM | POA: Insufficient documentation

## 2017-12-24 DIAGNOSIS — R102 Pelvic and perineal pain: Secondary | ICD-10-CM

## 2017-12-24 HISTORY — DX: Unspecified appendicitis: K37

## 2018-01-14 ENCOUNTER — Ambulatory Visit (HOSPITAL_COMMUNITY)
Admission: RE | Admit: 2018-01-14 | Discharge: 2018-01-14 | Disposition: A | Discharge status: Home / Self Care | Payer: BLUE CROSS/BLUE SHIELD | Source: Ambulatory Visit | Attending: Obstetrics and Gynecology | Admitting: Obstetrics and Gynecology

## 2018-01-14 ENCOUNTER — Encounter (HOSPITAL_COMMUNITY): Payer: Self-pay

## 2018-01-14 ENCOUNTER — Other Ambulatory Visit (HOSPITAL_COMMUNITY): Payer: Self-pay | Admitting: Obstetrics and Gynecology

## 2018-01-14 DIAGNOSIS — Z362 Encounter for other antenatal screening follow-up: Secondary | ICD-10-CM | POA: Insufficient documentation

## 2018-01-14 DIAGNOSIS — R102 Pelvic and perineal pain: Secondary | ICD-10-CM

## 2018-01-14 DIAGNOSIS — Z8759 Personal history of other complications of pregnancy, childbirth and the puerperium: Secondary | ICD-10-CM

## 2018-01-14 DIAGNOSIS — N949 Unspecified condition associated with female genital organs and menstrual cycle: Secondary | ICD-10-CM

## 2018-01-14 DIAGNOSIS — O34219 Maternal care for unspecified type scar from previous cesarean delivery: Secondary | ICD-10-CM

## 2018-01-14 DIAGNOSIS — Z3A35 35 weeks gestation of pregnancy: Secondary | ICD-10-CM | POA: Diagnosis not present

## 2018-01-14 DIAGNOSIS — R1030 Lower abdominal pain, unspecified: Secondary | ICD-10-CM

## 2018-01-14 DIAGNOSIS — O26892 Other specified pregnancy related conditions, second trimester: Secondary | ICD-10-CM

## 2018-01-14 LAB — OB RESULTS CONSOLE GBS: GBS: NEGATIVE

## 2018-01-17 ENCOUNTER — Other Ambulatory Visit: Payer: Self-pay | Admitting: Obstetrics and Gynecology

## 2018-01-24 ENCOUNTER — Encounter (HOSPITAL_COMMUNITY): Payer: Self-pay

## 2018-01-25 ENCOUNTER — Telehealth (HOSPITAL_COMMUNITY): Payer: Self-pay | Admitting: *Deleted

## 2018-01-25 NOTE — Telephone Encounter (Signed)
Preadmission screen  

## 2018-01-28 ENCOUNTER — Encounter (HOSPITAL_COMMUNITY)
Admission: AD | Disposition: A | Discharge status: Home / Self Care | Payer: Self-pay | Source: Home / Self Care | Attending: Obstetrics and Gynecology

## 2018-01-28 ENCOUNTER — Inpatient Hospital Stay (HOSPITAL_COMMUNITY)
Admission: AD | Admit: 2018-01-28 | Discharge: 2018-01-30 | DRG: 785 | Disposition: A | Discharge status: Home / Self Care | Payer: BLUE CROSS/BLUE SHIELD | Attending: Obstetrics and Gynecology | Admitting: Obstetrics and Gynecology

## 2018-01-28 ENCOUNTER — Inpatient Hospital Stay (HOSPITAL_COMMUNITY): Payer: BLUE CROSS/BLUE SHIELD | Admitting: Anesthesiology

## 2018-01-28 ENCOUNTER — Inpatient Hospital Stay (HOSPITAL_COMMUNITY)
Admission: RE | Admit: 2018-01-28 | Payer: BLUE CROSS/BLUE SHIELD | Source: Ambulatory Visit | Admitting: Obstetrics and Gynecology

## 2018-01-28 ENCOUNTER — Other Ambulatory Visit: Payer: Self-pay

## 2018-01-28 ENCOUNTER — Encounter (HOSPITAL_COMMUNITY): Payer: Self-pay | Admitting: Anesthesiology

## 2018-01-28 DIAGNOSIS — Z87891 Personal history of nicotine dependence: Secondary | ICD-10-CM

## 2018-01-28 DIAGNOSIS — O365930 Maternal care for other known or suspected poor fetal growth, third trimester, not applicable or unspecified: Secondary | ICD-10-CM | POA: Diagnosis present

## 2018-01-28 DIAGNOSIS — Z3A37 37 weeks gestation of pregnancy: Secondary | ICD-10-CM

## 2018-01-28 DIAGNOSIS — Z302 Encounter for sterilization: Secondary | ICD-10-CM | POA: Diagnosis not present

## 2018-01-28 DIAGNOSIS — O34211 Maternal care for low transverse scar from previous cesarean delivery: Principal | ICD-10-CM | POA: Diagnosis present

## 2018-01-28 DIAGNOSIS — R1030 Lower abdominal pain, unspecified: Secondary | ICD-10-CM

## 2018-01-28 DIAGNOSIS — R102 Pelvic and perineal pain: Secondary | ICD-10-CM

## 2018-01-28 DIAGNOSIS — N949 Unspecified condition associated with female genital organs and menstrual cycle: Secondary | ICD-10-CM

## 2018-01-28 DIAGNOSIS — Z9889 Other specified postprocedural states: Secondary | ICD-10-CM

## 2018-01-28 DIAGNOSIS — O26892 Other specified pregnancy related conditions, second trimester: Secondary | ICD-10-CM

## 2018-01-28 LAB — CBC
HCT: 32.4 % — ABNORMAL LOW (ref 36.0–46.0)
Hemoglobin: 11.7 g/dL — ABNORMAL LOW (ref 12.0–15.0)
MCH: 32.4 pg (ref 26.0–34.0)
MCHC: 36.1 g/dL — ABNORMAL HIGH (ref 30.0–36.0)
MCV: 89.8 fL (ref 78.0–100.0)
PLATELETS: 187 10*3/uL (ref 150–400)
RBC: 3.61 MIL/uL — ABNORMAL LOW (ref 3.87–5.11)
RDW: 14.3 % (ref 11.5–15.5)
WBC: 16.1 10*3/uL — AB (ref 4.0–10.5)

## 2018-01-28 LAB — TYPE AND SCREEN
ABO/RH(D): O POS
ANTIBODY SCREEN: NEGATIVE

## 2018-01-28 LAB — ABO/RH: ABO/RH(D): O POS

## 2018-01-28 SURGERY — Surgical Case
Anesthesia: Spinal | Laterality: Bilateral

## 2018-01-28 MED ORDER — LACTATED RINGERS IV SOLN
INTRAVENOUS | Status: DC | PRN
  Administered 2018-01-28: 21:00:00 via INTRAVENOUS

## 2018-01-28 MED ORDER — NALBUPHINE HCL 10 MG/ML IJ SOLN: 5.0000 mg | Freq: Once | INTRAMUSCULAR | Status: DC | PRN

## 2018-01-28 MED ORDER — SODIUM CHLORIDE 0.9 % IR SOLN
Status: DC | PRN
  Administered 2018-01-28: 800 mL via INTRAVESICAL

## 2018-01-28 MED ORDER — DIPHENHYDRAMINE HCL 25 MG PO CAPS: 25.0000 mg | ORAL_CAPSULE | ORAL | Status: DC | PRN

## 2018-01-28 MED ORDER — MEPERIDINE HCL 25 MG/ML IJ SOLN: 6.2500 mg | INTRAMUSCULAR | Status: DC | PRN

## 2018-01-28 MED ORDER — OXYCODONE HCL 5 MG/5ML PO SOLN: 5.0000 mg | Freq: Once | ORAL | Status: DC | PRN

## 2018-01-28 MED ORDER — OXYTOCIN 10 UNIT/ML IJ SOLN
INTRAVENOUS | Status: DC | PRN
  Administered 2018-01-28: 40 [IU] via INTRAVENOUS

## 2018-01-28 MED ORDER — SCOPOLAMINE 1 MG/3DAYS TD PT72
MEDICATED_PATCH | TRANSDERMAL | Status: DC | PRN
  Administered 2018-01-28: 1 via TRANSDERMAL

## 2018-01-28 MED ORDER — ONDANSETRON HCL 4 MG/2ML IJ SOLN
INTRAMUSCULAR | Status: DC | PRN
  Administered 2018-01-28: 4 mg via INTRAVENOUS

## 2018-01-28 MED ORDER — LACTATED RINGERS IV SOLN
INTRAVENOUS | Status: DC | PRN
  Administered 2018-01-28 (×3): via INTRAVENOUS

## 2018-01-28 MED ORDER — MORPHINE SULFATE (PF) 0.5 MG/ML IJ SOLN
INTRAMUSCULAR | Status: AC
  Filled 2018-01-28: qty 10

## 2018-01-28 MED ORDER — NALBUPHINE HCL 10 MG/ML IJ SOLN: 5.0000 mg | INTRAMUSCULAR | Status: DC | PRN

## 2018-01-28 MED ORDER — ONDANSETRON HCL 4 MG/2ML IJ SOLN
INTRAMUSCULAR | Status: AC
  Filled 2018-01-28: qty 2

## 2018-01-28 MED ORDER — FENTANYL CITRATE (PF) 100 MCG/2ML IJ SOLN
INTRAMUSCULAR | Status: DC | PRN
  Administered 2018-01-28: 10 ug via INTRATHECAL

## 2018-01-28 MED ORDER — ONDANSETRON HCL 4 MG/2ML IJ SOLN: 4.0000 mg | Freq: Four times a day (QID) | INTRAMUSCULAR | Status: DC | PRN

## 2018-01-28 MED ORDER — FENTANYL CITRATE (PF) 100 MCG/2ML IJ SOLN: 25.0000 ug | INTRAMUSCULAR | Status: DC | PRN

## 2018-01-28 MED ORDER — MORPHINE SULFATE (PF) 0.5 MG/ML IJ SOLN
INTRAMUSCULAR | Status: DC | PRN
  Administered 2018-01-28: .2 mg via INTRATHECAL

## 2018-01-28 MED ORDER — OXYCODONE HCL 5 MG PO TABS: 5.0000 mg | ORAL_TABLET | Freq: Once | ORAL | Status: DC | PRN

## 2018-01-28 MED ORDER — DIPHENHYDRAMINE HCL 50 MG/ML IJ SOLN: 12.5000 mg | INTRAMUSCULAR | Status: DC | PRN

## 2018-01-28 MED ORDER — KETOROLAC TROMETHAMINE 30 MG/ML IJ SOLN
30.0000 mg | Freq: Four times a day (QID) | INTRAMUSCULAR | Status: AC | PRN
  Administered 2018-01-28: 30 mg via INTRAMUSCULAR

## 2018-01-28 MED ORDER — NALOXONE HCL 0.4 MG/ML IJ SOLN: 0.4000 mg | INTRAMUSCULAR | Status: DC | PRN

## 2018-01-28 MED ORDER — LACTATED RINGERS IV SOLN
INTRAVENOUS | Status: DC
  Administered 2018-01-28: 19:00:00 via INTRAVENOUS

## 2018-01-28 MED ORDER — DEXAMETHASONE SODIUM PHOSPHATE 10 MG/ML IJ SOLN
INTRAMUSCULAR | Status: DC | PRN
  Administered 2018-01-28: 4 mg via INTRAVENOUS

## 2018-01-28 MED ORDER — PHENYLEPHRINE 8 MG IN D5W 100 ML (0.08MG/ML) PREMIX OPTIME
INJECTION | INTRAVENOUS | Status: DC | PRN
  Administered 2018-01-28: 60 ug/min via INTRAVENOUS

## 2018-01-28 MED ORDER — DEXAMETHASONE SODIUM PHOSPHATE 4 MG/ML IJ SOLN
INTRAMUSCULAR | Status: AC
  Filled 2018-01-28: qty 3

## 2018-01-28 MED ORDER — ONDANSETRON HCL 4 MG/2ML IJ SOLN: 4.0000 mg | Freq: Three times a day (TID) | INTRAMUSCULAR | Status: DC | PRN

## 2018-01-28 MED ORDER — BUPIVACAINE IN DEXTROSE 0.75-8.25 % IT SOLN
INTRATHECAL | Status: DC | PRN
  Administered 2018-01-28: 1.5 mL via INTRATHECAL

## 2018-01-28 MED ORDER — FENTANYL CITRATE (PF) 100 MCG/2ML IJ SOLN
INTRAMUSCULAR | Status: AC
  Filled 2018-01-28: qty 2

## 2018-01-28 MED ORDER — OXYTOCIN 10 UNIT/ML IJ SOLN
INTRAMUSCULAR | Status: AC
  Filled 2018-01-28: qty 4

## 2018-01-28 MED ORDER — CEFAZOLIN SODIUM-DEXTROSE 2-4 GM/100ML-% IV SOLN
2.0000 g | INTRAVENOUS | Status: AC
  Administered 2018-01-28: 2 g via INTRAVENOUS

## 2018-01-28 MED ORDER — SCOPOLAMINE 1 MG/3DAYS TD PT72
1.0000 | MEDICATED_PATCH | Freq: Once | TRANSDERMAL | Status: DC
  Filled 2018-01-28: qty 1

## 2018-01-28 MED ORDER — KETOROLAC TROMETHAMINE 30 MG/ML IJ SOLN
INTRAMUSCULAR | Status: AC
  Filled 2018-01-28: qty 1

## 2018-01-28 MED ORDER — NALOXONE HCL 4 MG/10ML IJ SOLN
1.0000 ug/kg/h | INTRAVENOUS | Status: DC | PRN
  Filled 2018-01-28: qty 5

## 2018-01-28 MED ORDER — SODIUM CHLORIDE 0.9% FLUSH: 3.0000 mL | INTRAVENOUS | Status: DC | PRN

## 2018-01-28 MED ORDER — KETOROLAC TROMETHAMINE 30 MG/ML IJ SOLN: 30.0000 mg | Freq: Four times a day (QID) | INTRAMUSCULAR | Status: AC | PRN

## 2018-01-28 MED ORDER — SODIUM CHLORIDE 0.9 % IR SOLN
Status: DC | PRN
  Administered 2018-01-28: 1000 mL

## 2018-01-28 SURGICAL SUPPLY — 35 items
BENZOIN TINCTURE PRP APPL 2/3 (GAUZE/BANDAGES/DRESSINGS) ×3 IMPLANT
CLAMP CORD UMBIL (MISCELLANEOUS) IMPLANT
CLIP FILSHIE TUBAL LIGA STRL (Clip) ×6 IMPLANT
CLOSURE STERI STRIP 1/2 X4 (GAUZE/BANDAGES/DRESSINGS) ×3 IMPLANT
CLOTH BEACON ORANGE TIMEOUT ST (SAFETY) ×3 IMPLANT
DRSG OPSITE POSTOP 4X10 (GAUZE/BANDAGES/DRESSINGS) ×3 IMPLANT
DURAPREP 26ML APPLICATOR (WOUND CARE) ×3 IMPLANT
ELECT REM PT RETURN 9FT ADLT (ELECTROSURGICAL) ×3
ELECTRODE REM PT RTRN 9FT ADLT (ELECTROSURGICAL) ×1 IMPLANT
EXTRACTOR VACUUM BELL STYLE (SUCTIONS) IMPLANT
GLOVE BIO SURGEON STRL SZ7 (GLOVE) ×3 IMPLANT
GLOVE BIOGEL PI IND STRL 7.0 (GLOVE) ×1 IMPLANT
GLOVE BIOGEL PI INDICATOR 7.0 (GLOVE) ×2
GOWN STRL REUS W/TWL LRG LVL3 (GOWN DISPOSABLE) ×6 IMPLANT
KIT ABG SYR 3ML LUER SLIP (SYRINGE) IMPLANT
NEEDLE HYPO 25X5/8 SAFETYGLIDE (NEEDLE) IMPLANT
NS IRRIG 1000ML POUR BTL (IV SOLUTION) ×3 IMPLANT
PACK C SECTION WH (CUSTOM PROCEDURE TRAY) ×3 IMPLANT
PAD OB MATERNITY 4.3X12.25 (PERSONAL CARE ITEMS) ×3 IMPLANT
PENCIL SMOKE EVAC W/HOLSTER (ELECTROSURGICAL) ×3 IMPLANT
RTRCTR C-SECT PINK 25CM LRG (MISCELLANEOUS) ×3 IMPLANT
STRIP CLOSURE SKIN 1/2X4 (GAUZE/BANDAGES/DRESSINGS) ×2 IMPLANT
SUT MNCRL 0 VIOLET CTX 36 (SUTURE) ×2 IMPLANT
SUT MONOCRYL 0 CTX 36 (SUTURE) ×4
SUT PDS AB 0 CTX 60 (SUTURE) IMPLANT
SUT PLAIN 2 0 (SUTURE) ×2
SUT PLAIN 2 0 XLH (SUTURE) IMPLANT
SUT PLAIN ABS 2-0 CT1 27XMFL (SUTURE) ×1 IMPLANT
SUT VIC AB 0 CT1 27 (SUTURE) ×4
SUT VIC AB 0 CT1 27XBRD ANBCTR (SUTURE) ×2 IMPLANT
SUT VIC AB 2-0 CT1 27 (SUTURE) ×4
SUT VIC AB 2-0 CT1 TAPERPNT 27 (SUTURE) ×2 IMPLANT
SUT VIC AB 4-0 KS 27 (SUTURE) ×3 IMPLANT
TOWEL OR 17X24 6PK STRL BLUE (TOWEL DISPOSABLE) ×3 IMPLANT
TRAY FOLEY W/BAG SLVR 14FR LF (SET/KITS/TRAYS/PACK) ×3 IMPLANT

## 2018-01-28 NOTE — Anesthesia Preprocedure Evaluation (Signed)
Anesthesia Evaluation  Patient identified by MRN, date of birth, ID band Patient awake    Reviewed: Allergy & Precautions, H&P , NPO status , Patient's Chart, lab work & pertinent test results  Airway Mallampati: II   Neck ROM: full    Dental   Pulmonary former smoker,    breath sounds clear to auscultation       Cardiovascular negative cardio ROS   Rhythm:regular Rate:Normal     Neuro/Psych PSYCHIATRIC DISORDERS Anxiety Depression    GI/Hepatic   Endo/Other    Renal/GU      Musculoskeletal   Abdominal   Peds  Hematology   Anesthesia Other Findings   Reproductive/Obstetrics (+) Pregnancy Prior C/S                             Anesthesia Physical Anesthesia Plan  ASA: II  Anesthesia Plan: Spinal   Post-op Pain Management:    Induction: Intravenous  PONV Risk Score and Plan: 2 and Ondansetron, Treatment may vary due to age or medical condition and Scopolamine patch - Pre-op  Airway Management Planned: Nasal Cannula  Additional Equipment:   Intra-op Plan:   Post-operative Plan:   Informed Consent: I have reviewed the patients History and Physical, chart, labs and discussed the procedure including the risks, benefits and alternatives for the proposed anesthesia with the patient or authorized representative who has indicated his/her understanding and acceptance.     Plan Discussed with: CRNA, Anesthesiologist and Surgeon  Anesthesia Plan Comments:         Anesthesia Quick Evaluation

## 2018-01-28 NOTE — Brief Op Note (Signed)
01/28/2018  9:46 PM  PATIENT:  Kaitlyn Larson  22 y.o. female  PRE-OPERATIVE DIAGNOSIS:  IUGR / REPEAT BY 39 WEEKS PER MFM NKDA / EDD: 02/15/18 OK'D BY OR  POST-OPERATIVE DIAGNOSIS:  * No post-op diagnosis entered *  PROCEDURE:  Procedure(s): REPEAT CESAREAN SECTION WITH BILATERAL TUBAL LIGATION (Bilateral)  SURGEON:  Surgeon(s) and Role:    Carrington Clamp* Erica Osuna, MD - Primary   ANESTHESIA:   spinal  EBL:  386 mL   SPECIMEN:  Source of Specimen:  placenta  DISPOSITION OF SPECIMEN:  PATHOLOGY  COUNTS:  YES  TOURNIQUET:  * No tourniquets in log *  DICTATION: .Note written in EPIC  PLAN OF CARE: Admit to inpatient   PATIENT DISPOSITION:  PACU - hemodynamically stable.   Delay start of Pharmacological VTE agent (>24hrs) due to surgical blood loss or risk of bleeding: not applicable

## 2018-01-28 NOTE — Progress Notes (Signed)
Apgars 8,9.

## 2018-01-28 NOTE — MAU Note (Signed)
Her dr's sent her over for a c/s.  She is 37 wks and they don't want her to go any further.  Prior c/s.  They said something about the babies weight and him not growing.

## 2018-01-28 NOTE — Anesthesia Procedure Notes (Signed)
Spinal  Patient location during procedure: OR Start time: 01/28/2018 9:02 PM End time: 01/28/2018 9:05 PM Staffing Anesthesiologist: Achille RichHodierne, Kais Monje, MD Performed: anesthesiologist  Preanesthetic Checklist Completed: patient identified, surgical consent, pre-op evaluation, timeout performed, IV checked, risks and benefits discussed and monitors and equipment checked Spinal Block Patient position: sitting Prep: DuraPrep Patient monitoring: cardiac monitor, continuous pulse ox and blood pressure Approach: midline Location: L3-4 Injection technique: single-shot Needle Needle type: Pencan  Needle gauge: 24 G Needle length: 9 cm Assessment Sensory level: T10 Additional Notes Functioning IV was confirmed and monitors were applied. Sterile prep and drape, including hand hygiene and sterile gloves were used. The patient was positioned and the spine was prepped. The skin was anesthetized with lidocaine.  Free flow of clear CSF was obtained prior to injecting local anesthetic into the CSF.  The spinal needle aspirated freely following injection.  The needle was carefully withdrawn.  The patient tolerated the procedure well.

## 2018-01-28 NOTE — H&P (Signed)
22 y.o.  G2P0101 397w3d comes in for a repeat cesarean section at term.  Patient has good fetal movement and no bleeding. Pt has been followed for last couple months for IUGR.  Her first baby had IUGR.  She remains without history or signs of GHTN.   Pt was originally scheduled for repeat c/s and BTL at 39 weeks.  However, pt had a follow up US for growth today.  Although dopplers, BPP and AFI were all reassuring, there had been no interval growth at all in last 3 weeks.  A phone consult with MFM agreed with plan to proceed with repeat c/s as soon as feasible.   Pt desires strongly to have permanent sterilization despite high risk of regret and other options which are reversible while being as effective.  I will wait to see how baby is doing before proceeding with BTL today.   Past Medical History:  Diagnosis Date  . Anxiety   . Appendicitis   . Broken nose   . Depression     Past Surgical History:  Procedure Laterality Date  . APPENDECTOMY    . CESAREAN SECTION      OB History  Gravida Para Term Preterm AB Living  2 1 0 1 0 1  SAB TAB Ectopic Multiple Live Births  0 0 0 0 1    # Outcome Date GA Lbr Len/2nd Weight Sex Delivery Anes PTL Lv  2 Current           1 Preterm 2014 2966w0d  5 lb 4 oz (2.381 kg) F CS-LTranv   LIV    Social History   Socioeconomic History  . Marital status: Married    Spouse name: Not on file  . Number of children: Not on file  . Years of education: Not on file  . Highest education level: Not on file  Occupational History  . Not on file  Social Needs  . Financial resource strain: Not on file  . Food insecurity:    Worry: Not on file    Inability: Not on file  . Transportation needs:    Medical: Not on file    Non-medical: Not on file  Tobacco Use  . Smoking status: Former Smoker    Types: Cigarettes    Last attempt to quit: 06/25/2017    Years since quitting: 0.5  . Smokeless tobacco: Never Used  Substance and Sexual Activity  . Alcohol use: Not  Currently    Comment: not while pregnant  . Drug use: No    Types: Marijuana    Comment: in past  . Sexual activity: Yes    Birth control/protection: None  Lifestyle  . Physical activity:    Days per week: Not on file    Minutes per session: Not on file  . Stress: Not on file  Relationships  . Social connections:    Talks on phone: Not on file    Gets together: Not on file    Attends religious service: Not on file    Active member of club or organization: Not on file    Attends meetings of clubs or organizations: Not on file    Relationship status: Not on file  . Intimate partner violence:    Fear of current or ex partner: Not on file    Emotionally abused: Not on file    Physically abused: Not on file    Forced sexual activity: Not on file  Other Topics Concern  . Not on file  Social History Narrative  . Not on file   Lactose intolerance (gi)   Prenatal Course: IUGR worsening over last month with no interval growth in last 3 week.  ANT remains reassuring. Mom with no other problems except for hx of prior IUGR birth.    Prenatal Transfer Tool  Maternal Diabetes: No Genetic Screening: Normal Maternal Ultrasounds/Referrals: Normal Fetal Ultrasounds or other Referrals:  MFM for IUGR and possible pericardial effusion- this was judged by MFM to be within normal limits.  Maternal Substance Abuse:  No Significant Maternal Medications:  None Significant Maternal Lab Results: None  Vitals:   01/28/18 1813 01/28/18 1851  BP: (!) 99/56 (!) 138/57  Pulse: 90 75  Resp: 16 18  Temp: 98.3 F (36.8 C) 98.1 F (36.7 C)  TempSrc: Oral Oral  SpO2: 98%   Weight: 139 lb (63 kg) 139 lb 8 oz (63.3 kg)  Height: 5\' 2"  (1.575 m) 5\' 2"  (1.575 m)    Lungs/Cor:  NAD Abdomen:  soft, gravid Ex:  no cords, erythema SVE:  NA FHTs:  present  A/P   For repeat cesarean sectionat term for IUGR.  Pt strongly desires BTL as above.  All risks, benefits and alternatives discussed with patient  and she desires to proceed.  Odas Ozer A

## 2018-01-28 NOTE — Transfer of Care (Signed)
Immediate Anesthesia Transfer of Care Note  Patient: Kaitlyn Larson  Procedure(s) Performed: REPEAT CESAREAN SECTION WITH BILATERAL TUBAL LIGATION (Bilateral )  Patient Location: PACU  Anesthesia Type:Spinal  Level of Consciousness: awake, alert , oriented and patient cooperative  Airway & Oxygen Therapy: Patient Spontanous Breathing  Post-op Assessment: Report given to RN and Post -op Vital signs reviewed and stable  Post vital signs: Reviewed and stable  Last Vitals:  Vitals Value Taken Time  BP 105/62 01/28/2018 10:05 PM  Temp    Pulse 72 01/28/2018 10:09 PM  Resp 15 01/28/2018 10:09 PM  SpO2 100 % 01/28/2018 10:09 PM  Vitals shown include unvalidated device data.  Last Pain:  Vitals:   01/28/18 1851  TempSrc: Oral      Patients Stated Pain Goal: 4 (01/28/18 1851)  Complications: No apparent anesthesia complications

## 2018-01-28 NOTE — Consult Note (Signed)
Neonatology Note:   Attendance at C-section:    I was asked by Dr. Henderson CloudHorvath to attend this repeat C/S at term due to IUGR status without recent growth.  Reassuring fetal status including dopplers. Mother's PMH includes anxiety/depression without HTN issues. She is a G2P0101, GBS not done with good prenatal care. ROM 0 hours before delivery, fluid clear. Infant vigorous with good spontaneous cry and tone. Needed only minimal bulb suctioning. 30 sec DCC.  Ap 8/9. Lungs clear to ausc in DR. To CN to care of Pediatrician.    Dineen Kidavid C. Leary RocaEhrmann, MD

## 2018-01-28 NOTE — MAU Note (Signed)
Spoke with [employer].  They are aware of the c/s- add on for tonight.  Have pt wait in the lobby, they will come get her

## 2018-01-28 NOTE — Op Note (Signed)
01/28/2018  9:46 PM  PATIENT:  Kaitlyn Larson  22 y.o. female  PRE-OPERATIVE DIAGNOSIS:  IUGR / REPEAT BY 39 WEEKS PER MFM NKDA / EDD: 02/15/18 OK'D BY OR  POST-OPERATIVE DIAGNOSIS:  * No post-op diagnosis entered *  PROCEDURE:  Procedure(s): REPEAT CESAREAN SECTION WITH BILATERAL TUBAL LIGATION (Bilateral)  SURGEON:  Surgeon(s) and Role:    Carrington Clamp* Ayansh Feutz, MD - Primary   ANESTHESIA:   spinal  EBL:  386 mL   SPECIMEN:  Source of Specimen:  placenta  DISPOSITION OF SPECIMEN:  PATHOLOGY  COUNTS:  YES  TOURNIQUET:  * No tourniquets in log *  DICTATION: .Note written in EPIC  PLAN OF CARE: Admit to inpatient   PATIENT DISPOSITION:  PACU - hemodynamically stable.   Delay start of Pharmacological VTE agent (>24hrs) due to surgical blood loss or risk of bleeding: not applicable  Complications:  None. Medications:  Ancef, Pitocin Findings:  Baby female, Apgars P, weight P.   Normal tubes, ovaries and uterus seen.  Baby was vigorous and skin to skin with mother after birth in the OR.  Technique:  After adequate spinal anesthesia was achieved, the patient was prepped and draped in usual sterile fashion.  A foley catheter was used to drain the bladder.  A pfannanstiel incision was made with the scalpel and carried down to the fascia with the bovie cautery. The fascia was incised in the midline with the scalpel and carried in a transverse curvilinear manner bilaterally.  The fascia was reflected superiorly and inferiorly off the rectus muscles and the muscles split in the midline.  A bowel free portion of the peritoneum was entered bluntly and then extended in a superior and inferior manner with good visualization of the bowel and bladder.  The Alexis instrument was then placed and the vesico-uterine fascia tented up and incised in a transverse curvilinear manner.  A 2 cm transverse incision was made in the upper portion of the lower uterine segment until the amnion was exposed.    The incision was extended transversely in a blunt manner.  Clear fluid was noted and the baby delivered in the vertex presentation without complication.  The baby was bulb suctioned and the cord was clamped and cut aftet stripping blood from cord into baby.  The baby was then handed to awaiting Neonatology.  The placenta was then delivered manually and the uterus cleared of all debris.  The uterine incision was then closed with a running lock stitch of 0 monocryl.  An imbricating layer of 0 monocryl was closed as well. Excellent hemostasis of the uterine incision was achieved and the abdomen was cleared with irrigation.  The peritoneum was closed with a running stitch of 2-0 vicryl.  This incorporated the rectus muscles as a separate layer.  The fascia was then closed with a running stitch of 0 vicryl.  The subcutaneous layer was closed with interrupted  stitches of 2-0 plain gut.  The skin was closed with 4-0 vicryl on a Keith needle and steri-strips.  The patient tolerated the procedure well and was returned to the recovery room in stable condition.  All counts were correct times three.  Daylan Boggess A

## 2018-01-29 ENCOUNTER — Encounter (HOSPITAL_COMMUNITY): Payer: Self-pay | Admitting: Obstetrics and Gynecology

## 2018-01-29 DIAGNOSIS — Z9889 Other specified postprocedural states: Secondary | ICD-10-CM

## 2018-01-29 LAB — CBC
HEMATOCRIT: 30.1 % — AB (ref 36.0–46.0)
Hemoglobin: 11 g/dL — ABNORMAL LOW (ref 12.0–15.0)
MCH: 32.9 pg (ref 26.0–34.0)
MCHC: 36.5 g/dL — AB (ref 30.0–36.0)
MCV: 90.1 fL (ref 78.0–100.0)
Platelets: 226 10*3/uL (ref 150–400)
RBC: 3.34 MIL/uL — ABNORMAL LOW (ref 3.87–5.11)
RDW: 14.3 % (ref 11.5–15.5)
WBC: 20.8 10*3/uL — ABNORMAL HIGH (ref 4.0–10.5)

## 2018-01-29 LAB — RPR: RPR: NONREACTIVE

## 2018-01-29 MED ORDER — FERROUS SULFATE 325 (65 FE) MG PO TABS
325.0000 mg | ORAL_TABLET | Freq: Two times a day (BID) | ORAL | Status: DC
  Administered 2018-01-29 – 2018-01-30 (×2): 325 mg via ORAL
  Filled 2018-01-29 (×3): qty 1

## 2018-01-29 MED ORDER — TETANUS-DIPHTH-ACELL PERTUSSIS 5-2.5-18.5 LF-MCG/0.5 IM SUSP: 0.5000 mL | Freq: Once | INTRAMUSCULAR | Status: DC

## 2018-01-29 MED ORDER — WITCH HAZEL-GLYCERIN EX PADS: 1.0000 "application " | MEDICATED_PAD | CUTANEOUS | Status: DC | PRN

## 2018-01-29 MED ORDER — DIPHENHYDRAMINE HCL 25 MG PO CAPS: 25.0000 mg | ORAL_CAPSULE | Freq: Four times a day (QID) | ORAL | Status: DC | PRN

## 2018-01-29 MED ORDER — SIMETHICONE 80 MG PO CHEW: 80.0000 mg | CHEWABLE_TABLET | ORAL | Status: DC | PRN

## 2018-01-29 MED ORDER — BISACODYL 10 MG RE SUPP: 10.0000 mg | Freq: Every day | RECTAL | Status: DC | PRN

## 2018-01-29 MED ORDER — OXYCODONE HCL 5 MG PO TABS: 10.0000 mg | ORAL_TABLET | ORAL | Status: DC | PRN

## 2018-01-29 MED ORDER — SENNOSIDES-DOCUSATE SODIUM 8.6-50 MG PO TABS
2.0000 | ORAL_TABLET | ORAL | Status: DC
  Administered 2018-01-30: 2 via ORAL
  Filled 2018-01-29: qty 2

## 2018-01-29 MED ORDER — METHYLERGONOVINE MALEATE 0.2 MG/ML IJ SOLN: 0.2000 mg | INTRAMUSCULAR | Status: DC | PRN

## 2018-01-29 MED ORDER — ZOLPIDEM TARTRATE 5 MG PO TABS: 5.0000 mg | ORAL_TABLET | Freq: Every evening | ORAL | Status: DC | PRN

## 2018-01-29 MED ORDER — IBUPROFEN 800 MG PO TABS
800.0000 mg | ORAL_TABLET | Freq: Three times a day (TID) | ORAL | Status: DC
  Administered 2018-01-29 – 2018-01-30 (×4): 800 mg via ORAL
  Filled 2018-01-29 (×4): qty 1

## 2018-01-29 MED ORDER — MENTHOL 3 MG MT LOZG: 1.0000 | LOZENGE | OROMUCOSAL | Status: DC | PRN

## 2018-01-29 MED ORDER — METHYLERGONOVINE MALEATE 0.2 MG PO TABS: 0.2000 mg | ORAL_TABLET | ORAL | Status: DC | PRN

## 2018-01-29 MED ORDER — FLEET ENEMA 7-19 GM/118ML RE ENEM: 1.0000 | ENEMA | Freq: Every day | RECTAL | Status: DC | PRN

## 2018-01-29 MED ORDER — OXYTOCIN 40 UNITS IN LACTATED RINGERS INFUSION - SIMPLE MED: 2.5000 [IU]/h | INTRAVENOUS | Status: AC

## 2018-01-29 MED ORDER — ACETAMINOPHEN 325 MG PO TABS: 650.0000 mg | ORAL_TABLET | ORAL | Status: DC | PRN

## 2018-01-29 MED ORDER — COCONUT OIL OIL: 1.0000 "application " | TOPICAL_OIL | Status: DC | PRN

## 2018-01-29 MED ORDER — OXYCODONE HCL 5 MG PO TABS
5.0000 mg | ORAL_TABLET | ORAL | Status: DC | PRN
  Administered 2018-01-30 (×2): 5 mg via ORAL
  Filled 2018-01-29 (×2): qty 1

## 2018-01-29 MED ORDER — MEASLES, MUMPS & RUBELLA VAC ~~LOC~~ INJ
0.5000 mL | INJECTION | Freq: Once | SUBCUTANEOUS | Status: DC
  Filled 2018-01-29: qty 0.5

## 2018-01-29 MED ORDER — SIMETHICONE 80 MG PO CHEW
80.0000 mg | CHEWABLE_TABLET | Freq: Three times a day (TID) | ORAL | Status: DC
  Administered 2018-01-29 – 2018-01-30 (×4): 80 mg via ORAL
  Filled 2018-01-29 (×5): qty 1

## 2018-01-29 MED ORDER — DIBUCAINE 1 % RE OINT: 1.0000 "application " | TOPICAL_OINTMENT | RECTAL | Status: DC | PRN

## 2018-01-29 MED ORDER — PRENATAL MULTIVITAMIN CH
1.0000 | ORAL_TABLET | Freq: Every day | ORAL | Status: DC
  Administered 2018-01-29 – 2018-01-30 (×2): 1 via ORAL
  Filled 2018-01-29 (×2): qty 1

## 2018-01-29 MED ORDER — SIMETHICONE 80 MG PO CHEW
80.0000 mg | CHEWABLE_TABLET | ORAL | Status: DC
  Administered 2018-01-30: 80 mg via ORAL
  Filled 2018-01-29: qty 1

## 2018-01-29 MED ORDER — LACTATED RINGERS IV SOLN
INTRAVENOUS | Status: DC
  Administered 2018-01-29: 07:00:00 via INTRAVENOUS

## 2018-01-29 NOTE — Lactation Note (Signed)
This note was copied from a baby's chart. Lactation Consultation Note  Patient Name: Kaitlyn Larson Kaitlyn Larson: 01/29/2018 Reason for consult: Initial assessment;Early term 37-38.6wks;Infant < 6lbs Baby is 17 hours old and has had only a few feedings.  Last breastfeeding was at 1100.  Voids and stools WNL.  RN assisted mom with hand expression and easily obtained 13 mls which was spoon fed to baby.  Baby sleepy after and would not latch to breast.  Discussed with mom the importance of feeding baby every 3 hours due to low birth weight.  RN will initiate symphony pump.  Instructed to pump and hand express every 3 hours.  Mom knows if baby doesn't feed and volume of expressed milk is minimal formula will be needed.  Encouraged to call for assist prn.  Maternal Data Has patient been taught Hand Expression?: Yes  Feeding Feeding Type: Breast Fed Length of feed: 20 min(per Mom, baby latched well and nursed for 20 min)  LATCH Score Latch: Too sleepy or reluctant, no latch achieved, no sucking elicited.  Audible Swallowing: None  Type of Nipple: Everted at rest and after stimulation  Comfort (Breast/Nipple): Soft / non-tender  Hold (Positioning): Assistance needed to correctly position infant at breast and maintain latch.  LATCH Score: 5  Interventions    Lactation Tools Discussed/Used Pump Review: Setup, frequency, and cleaning;Milk Storage Initiated by:: RN Larson initiated:: 01/29/18   Consult Status Consult Status: Follow-up Larson: 01/30/18 Follow-up type: In-patient    Huston FoleyMOULDEN, Xzavier Swinger S 01/29/2018, 3:14 PM

## 2018-01-29 NOTE — Anesthesia Postprocedure Evaluation (Signed)
Anesthesia Post Note  Patient: Kaitlyn Larson  Procedure(s) Performed: REPEAT CESAREAN SECTION WITH BILATERAL TUBAL LIGATION (Bilateral )     Patient location during evaluation: PACU Anesthesia Type: Spinal Level of consciousness: oriented and awake and alert Pain management: pain level controlled Vital Signs Assessment: post-procedure vital signs reviewed and stable Respiratory status: spontaneous breathing and respiratory function stable Cardiovascular status: blood pressure returned to baseline and stable Postop Assessment: no headache, no backache and no apparent nausea or vomiting Anesthetic complications: no    Last Vitals:  Vitals:   01/29/18 0230 01/29/18 0628  BP:  109/74  Pulse:  63  Resp: 18 16  Temp: 36.7 C 36.7 C  SpO2:  99%    Last Pain:  Vitals:   01/29/18 0638  TempSrc:   PainSc: 0-No pain   Pain Goal: Patients Stated Pain Goal: 4 (01/28/18 1851)               Lowella CurbWarren Ray Vearl Aitken

## 2018-01-29 NOTE — Anesthesia Postprocedure Evaluation (Signed)
Anesthesia Post Note  Patient: Kaitlyn Larson  Procedure(s) Performed: REPEAT CESAREAN SECTION WITH BILATERAL TUBAL LIGATION (Bilateral )     Patient location during evaluation: Mother Baby Anesthesia Type: Spinal Level of consciousness: awake and alert and oriented Pain management: satisfactory to patient Vital Signs Assessment: post-procedure vital signs reviewed and stable Respiratory status: spontaneous breathing and nonlabored ventilation Cardiovascular status: stable Postop Assessment: no headache, no backache, patient able to bend at knees, no signs of nausea or vomiting, adequate PO intake, spinal receding and no apparent nausea or vomiting Anesthetic complications: no    Last Vitals:  Vitals:   01/29/18 0230 01/29/18 0628  BP:  109/74  Pulse:  63  Resp: 18 16  Temp: 36.7 C 36.7 C  SpO2:  99%    Last Pain:  Vitals:   01/29/18 0638  TempSrc:   PainSc: 0-No pain   Pain Goal: Patients Stated Pain Goal: 4 (01/28/18 1851)               Madison HickmanGREGORY,Kouper Spinella

## 2018-01-29 NOTE — Progress Notes (Signed)
  Patient is eating, ambulating, voiding.  Pain control is good.  Vitals:   01/29/18 0025 01/29/18 0127 01/29/18 0230 01/29/18 0628  BP: 108/67 107/63  109/74  Pulse: 69 (!) 56  63  Resp: 18 18 18 16   Temp: 98.7 F (37.1 C) 97.9 F (36.6 C) 98 F (36.7 C) 98.1 F (36.7 C)  TempSrc: Oral Oral Oral Oral  SpO2: 100% 100%  99%  Weight:      Height:        lungs:   clear to auscultation cor:    RRR Abdomen:  soft, appropriate tenderness, incisions intact and without erythema or exudate ex:    no cords   Lab Results  Component Value Date   WBC 20.8 (H) 01/29/2018   HGB 11.0 (L) 01/29/2018   HCT 30.1 (L) 01/29/2018   MCV 90.1 01/29/2018   PLT 226 01/29/2018    --/--/O POS Performed at [facility], [address] [place], [place] [address]  (07/05 1915)/[place]  A/P    Post operative day 1.  Routine post op and postpartum care.  Expect d/c routine.  Percocet for pain control.

## 2018-01-29 NOTE — Addendum Note (Signed)
Addendum  created 01/29/18 0742 by Shanon PayorGregory, Wenzel Backlund M, CRNA   Sign clinical note

## 2018-01-29 NOTE — Progress Notes (Signed)
CSW received consult for [name] due to history of anxiety and depression. CSW met with [name], husband Davonte, and newborn at bedside. CSW obtained permission from [name] to discuss with spouse present. CSW inquired with [name] regarding history of anxiety and depression, [name] reported that she does not take medication and doesn't feel like she needs any. CSW inquired with [name] regarding inpatient stay at [facility] in 2016, she stated "I've been there twice." CSW asked [name] again about stay, [name] stated that she developed postpartum depression with her last pregnancy and that's how she ended up at [name]. [name] reports that she used to be on medication but is not now. [name] reports a good support system outside of the hospital. [name] educated [name] on baby blues and postpartum depression even though she was familiar with symptoms. CSW encouraged parents to reach out to her OB or CSW department for an assessment if postpartum depression develops in the coming weeks. Parents did not have any questions for CSW.  Kaitlyn Larson, MSW, [place] Social Worker [facility] [phone]

## 2018-01-30 MED ORDER — OXYCODONE-ACETAMINOPHEN 5-325 MG PO TABS: 1.0000 | ORAL_TABLET | ORAL | Status: DC | PRN

## 2018-01-30 MED ORDER — IBUPROFEN 800 MG PO TABS: 800.0000 mg | ORAL_TABLET | Freq: Three times a day (TID) | ORAL | 0 refills | Status: DC

## 2018-01-30 MED ORDER — OXYCODONE-ACETAMINOPHEN 5-325 MG PO TABS: 1.0000 | ORAL_TABLET | ORAL | 0 refills | Status: DC | PRN

## 2018-01-30 NOTE — Lactation Note (Signed)
This note was copied from a baby's chart. Lactation Consultation Note  Patient Name: Kaitlyn Larson ZOXWR'UToday's Date: 01/30/2018 Reason for consult: Follow-up assessment;Infant < 6lbs;Early term 3937-38.6wks Baby had circumcision this morning and very sleepy.  Mom recently pumped 25 mls but she said baby won't wake up enough to feed.  Encouraged to keep trying to give bottle of expressed breast milk.  Mom pumping and hand expressing every 3 hours.  Baby cluster fed last night.  Will follow up this afternoon.  Maternal Data    Feeding    LATCH Score                   Interventions    Lactation Tools Discussed/Used     Consult Status Consult Status: Follow-up Date: 01/30/18 Follow-up type: In-patient    Huston FoleyMOULDEN, Rachael Ferrie S 01/30/2018, 11:58 AM

## 2018-01-30 NOTE — Discharge Summary (Signed)
Obstetric Discharge Summary Reason for Admission: cesarean section and IUGR Prenatal Procedures: NST and ultrasound Intrapartum Procedures: cesarean: low cervical, transverse and tubal ligation Postpartum Procedures: none Complications-Operative and Postpartum: none Hemoglobin  Date Value Ref Range Status  01/29/2018 11.0 (L) 12.0 - 15.0 g/dL Final   HCT  Date Value Ref Range Status  01/29/2018 30.1 (L) 36.0 - 46.0 % Final    Discharge Diagnoses: Term Pregnancy-delivered  Discharge Information: Date: 01/30/2018 Activity: pelvic rest Diet: routine Medications: Ibuprofen and Percocet Condition: stable Instructions: refer to practice specific booklet Discharge to: home Follow-up Information    Carrington ClampHorvath, Dominick Zertuche, MD Follow up in 4 week(s).   Specialty:  Obstetrics and Gynecology Contact information: [address] [place] [address] [phone]           Newborn Data: Live born female  Birth Weight: 5 lb 7.5 oz (2480 g) APGAR: 8, 9  Newborn Delivery   Birth date/time:  01/28/2018 21:17:00 Delivery type:  C-Section, Low Vertical Trial of labor:  No C-section categorization:  Repeat     Home with mother.  Yordy Matton A 01/30/2018, 9:38 AM

## 2018-01-30 NOTE — Progress Notes (Signed)
  Patient is eating, ambulating, voiding.  Pain control is good.  Vitals:   01/29/18 1320 01/29/18 1451 01/29/18 2201 01/30/18 0549  BP: 104/68 104/66 (!) 99/41 96/72  Pulse: 69 65 64 63  Resp: 20 19 18 18   Temp: 98.6 F (37 C) 98.4 F (36.9 C) 98.9 F (37.2 C) 98.1 F (36.7 C)  TempSrc: Oral Oral Oral Oral  SpO2: 100%   99%  Weight:      Height:        lungs:   clear to auscultation cor:    RRR Abdomen:  soft, appropriate tenderness, incisions intact and without erythema or exudate ex:    no cords   Lab Results  Component Value Date   WBC 20.8 (H) 01/29/2018   HGB 11.0 (L) 01/29/2018   HCT 30.1 (L) 01/29/2018   MCV 90.1 01/29/2018   PLT 226 01/29/2018    --/--/O POS [facility]formed [facility], [address] [place], [place] [address]  (07/05 1917)/[place]  A/P    Post operative day 2.  Routine post op and postpartum care.  Expect d/c today.  Percocet for pain control.

## 2018-01-31 ENCOUNTER — Ambulatory Visit: Payer: Self-pay

## 2018-01-31 NOTE — Lactation Note (Signed)
This note was copied from a baby's chart. Lactation Consultation Note: Mother was given LPI parent instruction sheet. Reviewed need to make sure infant continues to feed with feeding cues and to feed at least 8-12 times in 24 hours. Recommend that mother observed infant for low energy and fatigue. Recommend that infant be supplemented if not feeding well according to LPI guidelines.  Mother assist with positioning infant with good pillow support and firm hold. Mother placed infant in cradle hold. Infant rolling from back to reach for mothers nipple. Advised mother in rolling infant tummy to tummy for better latch. Mother taught to flange infants lips for wider gape. Infant sustained latch for 25 mins. And observed frequent suckling and swallowing.   Mother was given a harmony hand pump . She doesn't have an electric pump at home and she is not active with WIC .   Mother advised to post pump for 15 mins on each breast. Advised mother to supplement infant with ebm using a wide base bottle nipple.  Discussed cluster feeding. Advised mother in treatment and prevention of engorgement. Mother receptive to all teaching.  [facility] and outpatient dept.    Patient Name: Kaitlyn Larson ZOXWR'UToday's Date: 01/31/2018 Reason for consult: Follow-up assessment   Maternal Data    Feeding Feeding Type: Breast Fed Length of feed: 25 min(infant still breastfeeding when I left the room)  LATCH Score Latch: Grasps breast easily, tongue down, lips flanged, rhythmical sucking.  Audible Swallowing: Spontaneous and intermittent  Type of Nipple: Everted at rest and after stimulation  Comfort (Breast/Nipple): Filling, red/small blisters or bruises, mild/mod discomfort(mothers nipple pink and tender)  Hold (Positioning): Assistance needed to correctly position infant at breast and maintain latch.  LATCH Score: 8  Interventions Interventions: Assisted with  latch;Skin to skin;Breast massage;Hand express;Breast compression;Adjust position;Support pillows;Position options;Expressed milk;Hand pump;Ice  Lactation Tools Discussed/Used     Consult Status Consult Status: Follow-up Date: 01/31/18 Follow-up type: In-patient    Stevan BornKendrick, [name] [facility] 01/31/2018, 10:30 AM

## 2018-02-07 ENCOUNTER — Encounter (HOSPITAL_COMMUNITY)
Admission: RE | Admit: 2018-02-07 | Discharge: 2018-02-07 | Disposition: A | Discharge status: Home / Self Care | Payer: BLUE CROSS/BLUE SHIELD | Source: Ambulatory Visit | Attending: Obstetrics and Gynecology | Admitting: Obstetrics and Gynecology

## 2020-12-02 ENCOUNTER — Emergency Department (HOSPITAL_COMMUNITY): Payer: Self-pay

## 2020-12-02 ENCOUNTER — Other Ambulatory Visit: Payer: Self-pay

## 2020-12-02 ENCOUNTER — Encounter (HOSPITAL_COMMUNITY): Payer: Self-pay | Admitting: Emergency Medicine

## 2020-12-02 ENCOUNTER — Emergency Department (HOSPITAL_COMMUNITY)
Admission: EM | Admit: 2020-12-02 | Discharge: 2020-12-02 | Disposition: A | Discharge status: Home / Self Care | Payer: Self-pay | Attending: Emergency Medicine | Admitting: Emergency Medicine

## 2020-12-02 DIAGNOSIS — R109 Unspecified abdominal pain: Secondary | ICD-10-CM | POA: Insufficient documentation

## 2020-12-02 DIAGNOSIS — N10 Acute tubulo-interstitial nephritis: Secondary | ICD-10-CM

## 2020-12-02 DIAGNOSIS — Z87891 Personal history of nicotine dependence: Secondary | ICD-10-CM | POA: Insufficient documentation

## 2020-12-02 LAB — CBC WITH DIFFERENTIAL/PLATELET
Abs Immature Granulocytes: 0.03 10*3/uL (ref 0.00–0.07)
Basophils Absolute: 0 10*3/uL (ref 0.0–0.1)
Basophils Relative: 0 %
Eosinophils Absolute: 0.1 10*3/uL (ref 0.0–0.5)
Eosinophils Relative: 0 %
HCT: 38.1 % (ref 36.0–46.0)
Hemoglobin: 13.4 g/dL (ref 12.0–15.0)
Immature Granulocytes: 0 %
Lymphocytes Relative: 13 %
Lymphs Abs: 1.6 10*3/uL (ref 0.7–4.0)
MCH: 32.8 pg (ref 26.0–34.0)
MCHC: 35.2 g/dL (ref 30.0–36.0)
MCV: 93.4 fL (ref 80.0–100.0)
Monocytes Absolute: 1 10*3/uL (ref 0.1–1.0)
Monocytes Relative: 8 %
Neutro Abs: 9.5 10*3/uL — ABNORMAL HIGH (ref 1.7–7.7)
Neutrophils Relative %: 79 %
Platelets: 277 10*3/uL (ref 150–400)
RBC: 4.08 MIL/uL (ref 3.87–5.11)
RDW: 13.2 % (ref 11.5–15.5)
WBC: 12.3 10*3/uL — ABNORMAL HIGH (ref 4.0–10.5)
nRBC: 0 % (ref 0.0–0.2)

## 2020-12-02 LAB — I-STAT BETA HCG BLOOD, ED (MC, WL, AP ONLY): I-stat hCG, quantitative: <5 m[IU]/mL (ref <5)

## 2020-12-02 LAB — COMPREHENSIVE METABOLIC PANEL
ALT: 10 U/L (ref 0–44)
AST: 19 U/L (ref 15–41)
Albumin: 4.1 g/dL (ref 3.5–5.0)
Alkaline Phosphatase: 56 U/L (ref 38–126)
Anion gap: 7 (ref 5–15)
BUN: 8 mg/dL (ref 6–20)
CO2: 24 mmol/L (ref 22–32)
Calcium: 9.1 mg/dL (ref 8.9–10.3)
Chloride: 105 mmol/L (ref 98–111)
Creatinine, Ser: 0.7 mg/dL (ref 0.44–1.00)
GFR, Estimated: >60 mL/min (ref >60)
Glucose, Bld: 121 mg/dL — ABNORMAL HIGH (ref 70–99)
Potassium: 3.9 mmol/L (ref 3.5–5.1)
Sodium: 136 mmol/L (ref 135–145)
Total Bilirubin: 0.8 mg/dL (ref 0.3–1.2)
Total Protein: 6.7 g/dL (ref 6.5–8.1)

## 2020-12-02 LAB — URINALYSIS, ROUTINE W REFLEX MICROSCOPIC
Bilirubin Urine: NEGATIVE
Glucose, UA: NEGATIVE mg/dL
Ketones, ur: NEGATIVE mg/dL
Nitrite: POSITIVE — AB
Protein, ur: 30 mg/dL — AB
Specific Gravity, Urine: 1.012 (ref 1.005–1.030)
WBC, UA: >50 WBC/hpf — ABNORMAL HIGH (ref 0–5)
pH: 6 (ref 5.0–8.0)

## 2020-12-02 MED ORDER — KETOROLAC TROMETHAMINE 30 MG/ML IJ SOLN
30.0000 mg | Freq: Once | INTRAMUSCULAR | Status: AC
  Administered 2020-12-02: 30 mg via INTRAVENOUS
  Filled 2020-12-02: qty 1

## 2020-12-02 MED ORDER — METHOCARBAMOL 500 MG PO TABS
500.0000 mg | ORAL_TABLET | Freq: Once | ORAL | Status: AC
  Administered 2020-12-02: 500 mg via ORAL
  Filled 2020-12-02: qty 1

## 2020-12-02 MED ORDER — HYDROMORPHONE HCL 1 MG/ML IJ SOLN
1.0000 mg | Freq: Once | INTRAMUSCULAR | Status: AC
  Administered 2020-12-02: 1 mg via INTRAVENOUS
  Filled 2020-12-02: qty 1

## 2020-12-02 MED ORDER — SODIUM CHLORIDE 0.9 % IV BOLUS
1000.0000 mL | Freq: Once | INTRAVENOUS | Status: AC
  Administered 2020-12-02: 1000 mL via INTRAVENOUS

## 2020-12-02 MED ORDER — LACTATED RINGERS IV BOLUS
1000.0000 mL | Freq: Once | INTRAVENOUS | Status: AC
  Administered 2020-12-02: 1000 mL via INTRAVENOUS

## 2020-12-02 MED ORDER — SODIUM CHLORIDE 0.9 % IV SOLN
2.0000 g | Freq: Once | INTRAVENOUS | Status: AC
  Administered 2020-12-02: 2 g via INTRAVENOUS
  Filled 2020-12-02: qty 20

## 2020-12-02 MED ORDER — HYDROMORPHONE HCL 1 MG/ML IJ SOLN
0.5000 mg | Freq: Once | INTRAMUSCULAR | Status: AC
  Administered 2020-12-02: 0.5 mg via INTRAVENOUS
  Filled 2020-12-02: qty 1

## 2020-12-02 MED ORDER — CEPHALEXIN 500 MG PO CAPS: 500.0000 mg | ORAL_CAPSULE | Freq: Four times a day (QID) | ORAL | 0 refills | Status: AC

## 2020-12-02 MED ORDER — IBUPROFEN 800 MG PO TABS: 800.0000 mg | ORAL_TABLET | Freq: Three times a day (TID) | ORAL | 0 refills | Status: AC

## 2020-12-02 MED ORDER — ONDANSETRON 8 MG PO TBDP: ORAL_TABLET | ORAL | 0 refills | Status: AC

## 2020-12-02 MED ORDER — OXYCODONE-ACETAMINOPHEN 5-325 MG PO TABS: 2.0000 | ORAL_TABLET | ORAL | 0 refills | Status: AC | PRN

## 2020-12-02 MED ORDER — ONDANSETRON HCL 4 MG/2ML IJ SOLN
4.0000 mg | Freq: Once | INTRAMUSCULAR | Status: AC
  Administered 2020-12-02: 4 mg via INTRAVENOUS
  Filled 2020-12-02: qty 2

## 2020-12-02 NOTE — ED Notes (Signed)
Pt to CT

## 2020-12-02 NOTE — ED Notes (Signed)
Lab to add on urine culture

## 2020-12-02 NOTE — ED Notes (Signed)
ED PA made aware of pt continued hypotension. New order for NS bolus received.

## 2020-12-02 NOTE — ED Provider Notes (Signed)
[facility] EMERGENCY DEPARTMENT Provider Note   CSN: [id number] Arrival date & time: 12/02/20  0236     History Chief Complaint  Patient presents with  . Flank Pain    Kaitlyn Larson is a 25 y.o. female.   Flank Pain This is a new problem. The current episode started 2 days ago. The problem occurs constantly. The problem has been gradually worsening. Pertinent negatives include no chest pain, no abdominal pain and no headaches. Nothing aggravates the symptoms. Nothing relieves the symptoms. She has tried nothing for the symptoms.       Past Medical History:  Diagnosis Date  . Anxiety   . Appendicitis   . Broken nose   . Depression     Patient Active Problem List   Diagnosis Date Noted  . Postoperative state 01/29/2018  . Encounter for antenatal screening for malformation using ultrasound   . Abnormal fetal ultrasound   . [redacted] weeks gestation of pregnancy   . Encounter for maternal care for suspected poor fetal growth in singleton pregnancy in third trimester   . Previous baby was small for gestational age   . Previous cesarean delivery affecting pregnancy   . Lower abdominal pain 10/04/2017  . Pelvic pressure in pregnancy, antepartum, second trimester 10/04/2017  . Severe episode of recurrent major depressive disorder, without psychotic features (HCC)   . PTSD (post-traumatic stress disorder) 05/12/2015  . MDD (major depressive disorder), recurrent episode, severe (HCC) 05/11/2015    Past Surgical History:  Procedure Laterality Date  . APPENDECTOMY    . CESAREAN SECTION    . CESAREAN SECTION WITH BILATERAL TUBAL LIGATION Bilateral 01/28/2018   Procedure: REPEAT CESAREAN SECTION WITH BILATERAL TUBAL LIGATION;  Surgeon: Carrington Clamp, MD;  Location: [facility] BIRTHING SUITES;  Service: Obstetrics;  Laterality: Bilateral;     OB History    Gravida  2   Para  2   Term  1   Preterm  1   AB  0   Living  2     SAB  0   IAB  0   Ectopic   0   Multiple  0   Live Births  2           Family History  Problem Relation Age of Onset  . Learning disabilities Sister   . Diabetes Maternal Grandmother     Social History   Tobacco Use  . Smoking status: Former Smoker    Types: Cigarettes    Quit date: 06/25/2017    Years since quitting: 3.4  . Smokeless tobacco: Never Used  Substance Use Topics  . Alcohol use: Not Currently    Comment: not while pregnant  . Drug use: No    Types: Marijuana    Comment: in past    Home Medications Prior to Admission medications   Medication Sig Start Date End Date Taking? Authorizing Provider  ibuprofen (ADVIL,MOTRIN) 800 MG tablet Take 1 tablet (800 mg total) by mouth 3 (three) times daily. 01/30/18   Carrington Clamp, MD  oxyCODONE-acetaminophen (PERCOCET/ROXICET) 5-325 MG tablet Take 1 tablet by mouth every 4 (four) hours as needed for severe pain. 01/30/18   Carrington Clamp, MD  Prenatal Vit w/Fe-Methylfol-FA (PNV PO) Take by mouth.    [provider]    Allergies    Lactose intolerance (gi)  Review of Systems   Review of Systems  Cardiovascular: Negative for chest pain.  Gastrointestinal: Negative for abdominal pain.  Genitourinary: Positive for flank pain.  Neurological: Negative for headaches.  All other systems reviewed and are negative.   Physical Exam Updated Vital Signs BP 102/63 (BP Location: Right Arm)   Pulse 74   Temp 99.2 F (37.3 C) (Oral)   Resp 20   SpO2 98%   Physical Exam  ED Results / Procedures / Treatments   Labs (all labs ordered are listed, but only abnormal results are displayed) Labs Reviewed  CBC WITH DIFFERENTIAL/PLATELET - Abnormal; Notable for the following components:      Result Value   WBC 12.3 (*)    Neutro Abs 9.5 (*)    All other components within normal limits  COMPREHENSIVE METABOLIC PANEL - Abnormal; Notable for the following components:   Glucose, Bld 121 (*)    All other components within normal limits   URINALYSIS, ROUTINE W REFLEX MICROSCOPIC  I-STAT BETA HCG BLOOD, ED ([facility], [facility], [facility] ONLY)    EKG None  Radiology CT Renal Stone Study  Result Date: 12/02/2020 CLINICAL DATA:  Flank pain.  Kidney stone suspected. EXAM: CT ABDOMEN AND PELVIS WITHOUT CONTRAST TECHNIQUE: Multidetector CT imaging of the abdomen and pelvis was performed following the standard protocol without IV contrast. COMPARISON:  None. FINDINGS: Lower chest: No acute abnormality. Hepatobiliary: The liver is enlarged measuring up to 19 cm. No focal liver abnormality. No gallstones, gallbladder wall thickening, or pericholecystic fluid. No biliary dilatation. Pancreas: No focal lesion. Normal pancreatic contour. No surrounding inflammatory changes. No main pancreatic ductal dilatation. Spleen: Normal in size without focal abnormality. Adrenals/Urinary Tract: No adrenal nodule bilaterally. Possible punctate calcification of the right kidney. Otherwise no obstructive nephrolithiasis, no frank hydronephrosis, and no contour-deforming renal mass. No ureterolithiasis. Fullness of the right collecting system with possible punctate right ureterovesicular junction stone. No left hydroureter. The urinary bladder is unremarkable. Stomach/Bowel: Stomach is within normal limits. No evidence of bowel wall thickening or dilatation. Appendix appears normal. Vascular/Lymphatic: No significant vascular findings are present. No enlarged abdominal or pelvic lymph nodes. Reproductive: Uterus and bilateral adnexa are unremarkable. Tubal ligation bilaterally. Other: No intraperitoneal free fluid. No intraperitoneal free gas. No organized fluid collection. Musculoskeletal: No acute or significant osseous findings. IMPRESSION: 1. Fullness of the right collecting system with possible punctate right ureterovesicular junction stone. 2. Nonobstructive punctate right nephrolithiasis. Electronically Signed   By: Tish Frederickson M.D.   On: 12/02/2020 05:39     Procedures Procedures   Medications Ordered in ED Medications  lactated ringers bolus 1,000 mL (1,000 mLs Intravenous New Bag/Given 12/02/20 0453)  HYDROmorphone (DILAUDID) injection 0.5 mg (0.5 mg Intravenous Given 12/02/20 0450)  ondansetron (ZOFRAN) injection 4 mg (4 mg Intravenous Given 12/02/20 0449)  methocarbamol (ROBAXIN) tablet 500 mg (500 mg Oral Given 12/02/20 0453)    ED Course  I have reviewed the triage vital signs and the nursing notes.  Pertinent labs & imaging results that were available during my care of the patient were reviewed by me and considered in my medical decision making (see chart for details).    MDM Rules/Calculators/A&P                          Probably had an infected stone that has since passed.  She has pain is controlled.  Her vital signs are stable.  Is think the patient is stable for discharge.  She is pending another liter of fluids but all of her antibiotics and pain medications have been prescribed.  She had 1 soft blood pressure by think  was positional because as she moved around to seem to improve  Final Clinical Impression(s) / ED Diagnoses Final diagnoses:  None    Rx / DC Orders ED Discharge Orders    None       Jahnaya Branscome, Barbara Cower, MD 12/03/20 0127

## 2020-12-02 NOTE — ED Triage Notes (Signed)
Patient reports persistent right flank pain  onset last week , denies hematuria or dysuria , no fever or emesis .

## 2020-12-04 LAB — URINE CULTURE: Culture: >=100000 — AB

## 2020-12-05 ENCOUNTER — Telehealth: Payer: Self-pay | Admitting: *Deleted

## 2020-12-05 NOTE — Telephone Encounter (Signed)
Post ED Visit - Positive Culture Follow-up  Culture report reviewed by antimicrobial stewardship pharmacist: [facility] []  , Pharm.D. []  Enzo Bi, Pharm.D., BCPS AQ-ID []  , Pharm.D., BCPS []  Celedonio Miyamoto, Pharm.D., BCPS []  [place], Garvin Fila.D., BCPS, AAHIVP []  , Pharm.D., BCPS, AAHIVP []  Georgina Pillion, PharmD, BCPS []  , PharmD, BCPS []  [place], PharmD, BCPS []  [address], PharmD []  , PharmD, BCPS []  Estella Husk, PharmD  Pharmacy Team []  Lysle Pearl, PharmD []  , PharmD []  Phillips Climes, PharmD []  , Rph []  Agapito Games) , PharmD []  Verlan Friends, PharmD []  , PharmD []  Mervyn Gay, PharmD []  , PharmD []  Vinnie Level, PharmD []  [facility], PharmD []  , PharmD []  Len Childs, PharmD   Positive urine culture Treated with Cephalexin, organism sensitive to the same and no further patient follow-up is required at this time. , PharmD  Greer Pickerel Talley 12/05/2020, 11:07 AM

## 2021-12-19 IMAGING — CT CT RENAL STONE PROTOCOL
2 of 4 series · 17 of 46 positions shown, 19 images · non-contrast
Comparison: None.

CLINICAL DATA: Flank pain.  Kidney stone suspected.

EXAM:
CT ABDOMEN AND PELVIS WITHOUT CONTRAST
TECHNIQUE: Multidetector CT imaging of the abdomen and pelvis was performed
following the standard protocol without IV contrast.

[Series 3: renal stone 5.0 · axial · 0.84mm/px · z∈[-462,-68]mm · 14 of 87 slices shown, 16 images]
[im 4/87  soft-tissue]
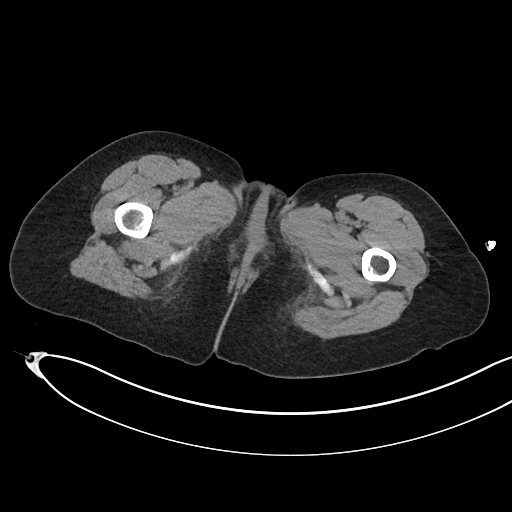
[im 4/87  bone]
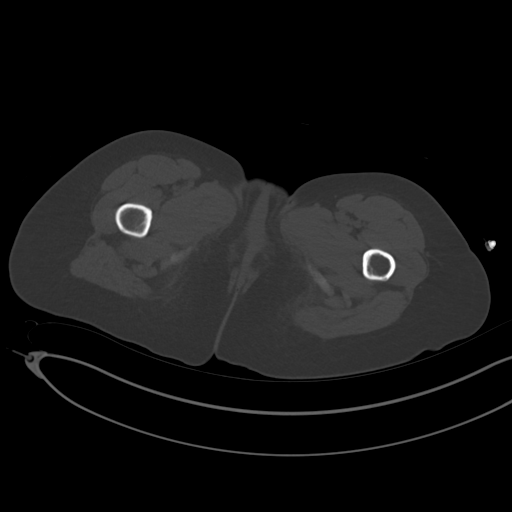
[im 11/87  soft-tissue]
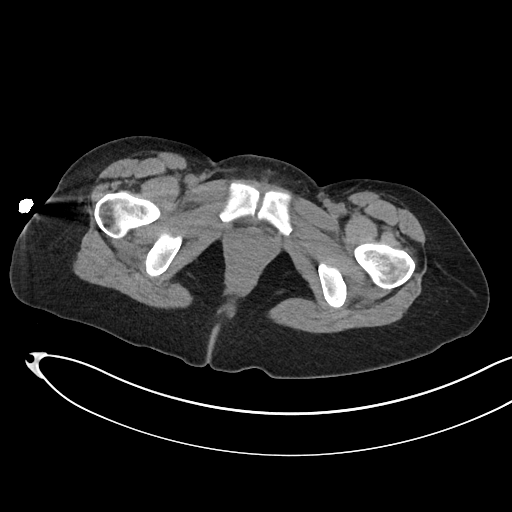
[im 18/87  soft-tissue]
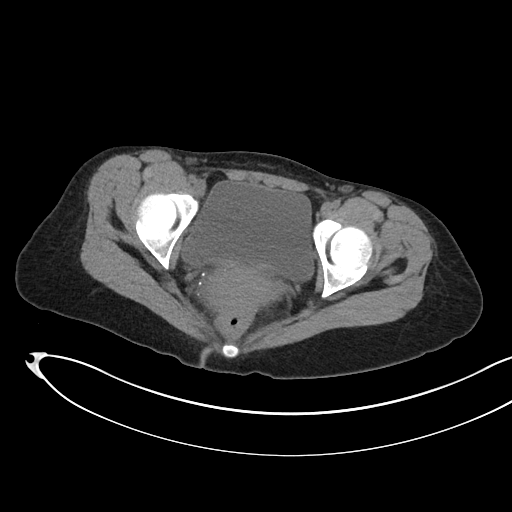
[im 25/87  soft-tissue]
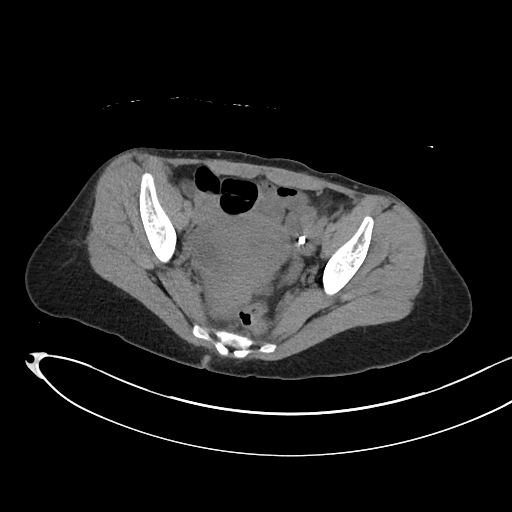
[im 28/87  soft-tissue]
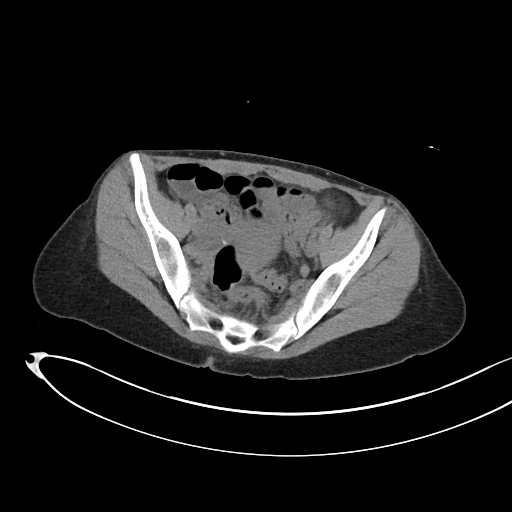
[im 35/87  soft-tissue]
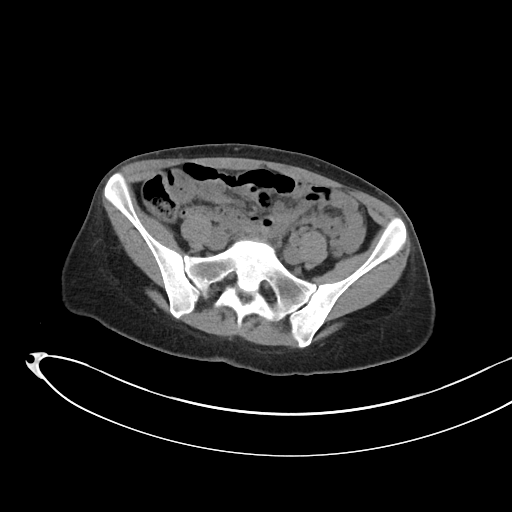
[im 42/87  soft-tissue]
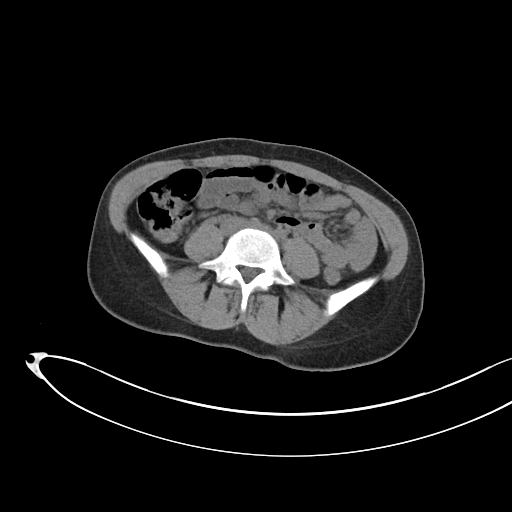
[im 45/87  soft-tissue]
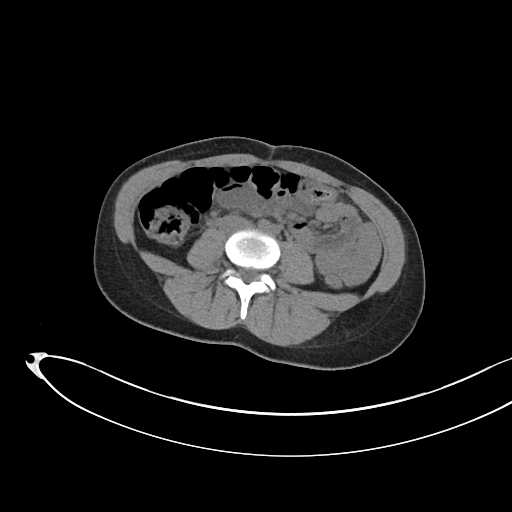
[im 52/87  soft-tissue]
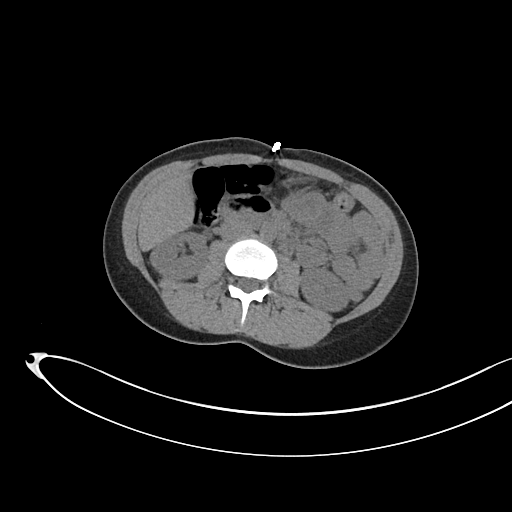
[im 52/87  bone]
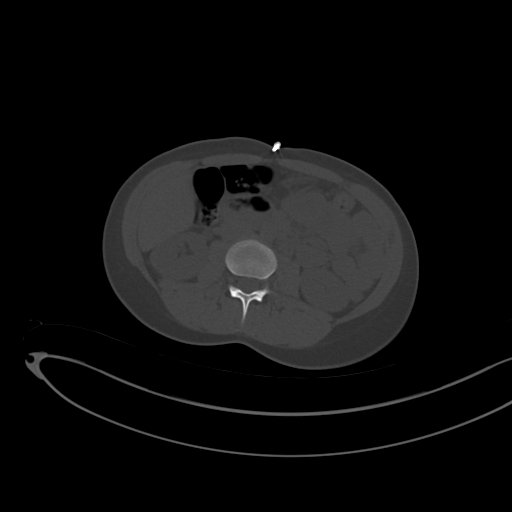
[im 59/87  soft-tissue]
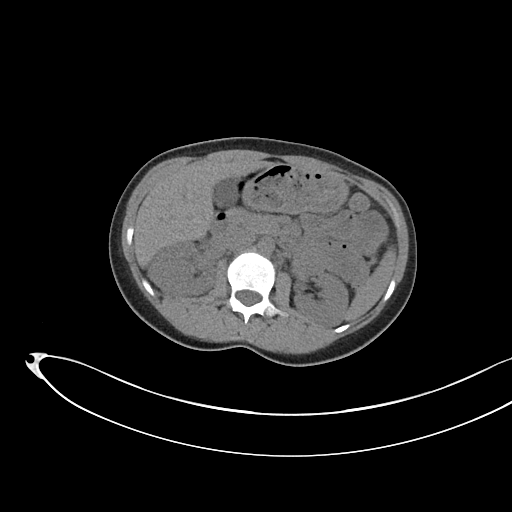
[im 66/87  soft-tissue]
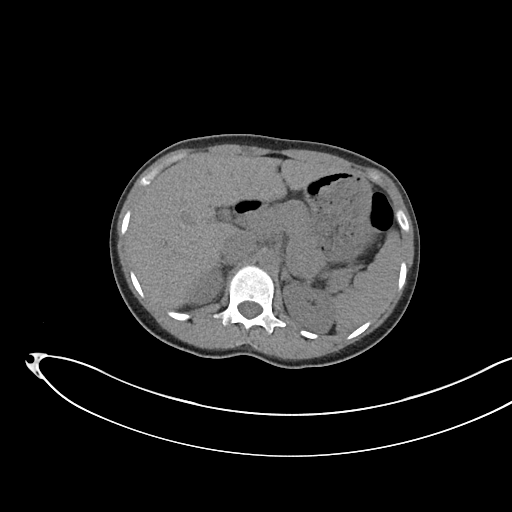
[im 69/87  soft-tissue]
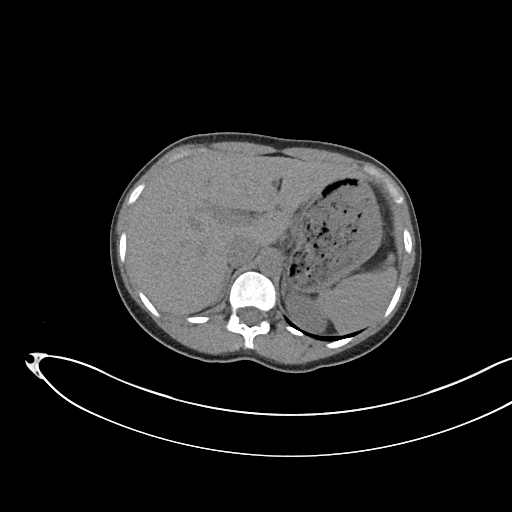
[im 76/87  soft-tissue]
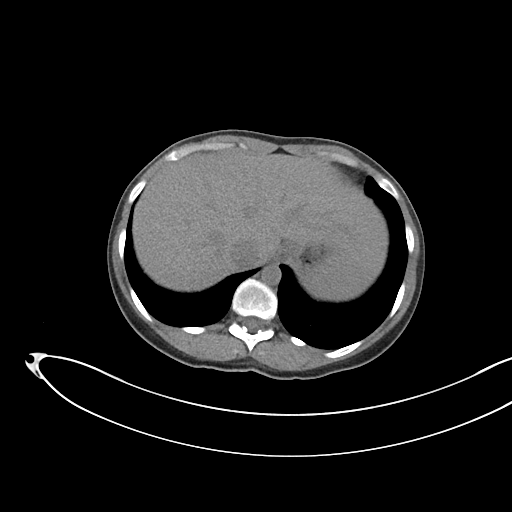
[im 83/87  soft-tissue]
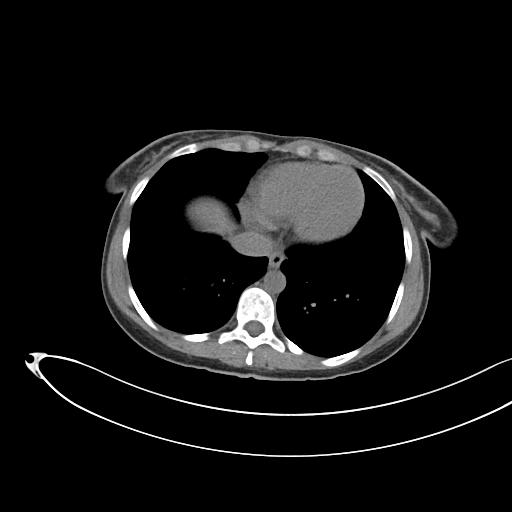

[Series 6: coronal · coronal · 0.82mm/px · 3 of 91 slices shown]
[im 41/91  soft-tissue]
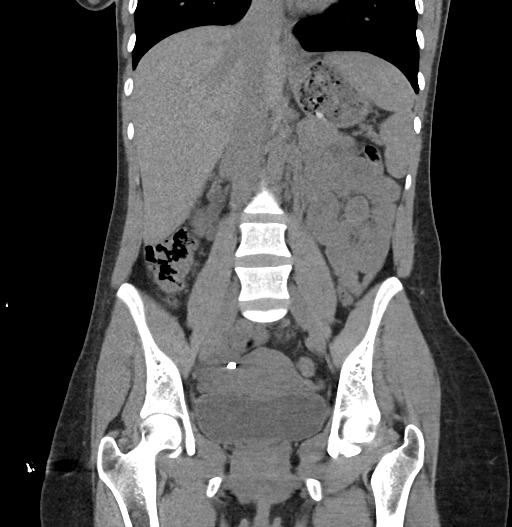
[im 51/91  soft-tissue]
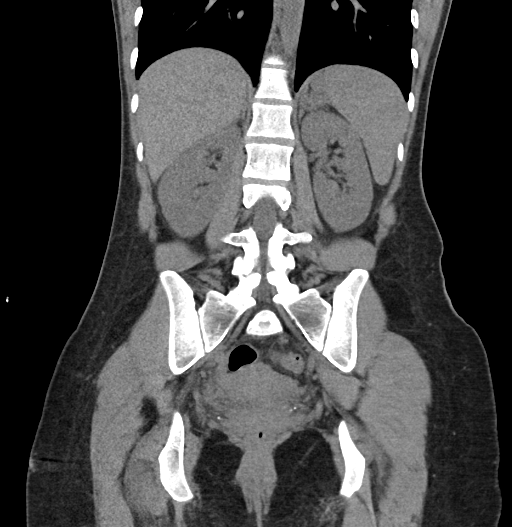
[im 61/91  soft-tissue]
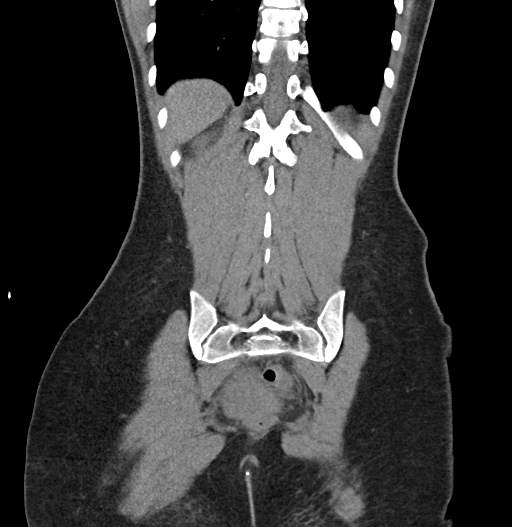

[17 of 46 positions shown; findings below may reference images not displayed]

FINDINGS: Lower chest: No acute abnormality.

Hepatobiliary: The liver is enlarged measuring up to 19 cm. No focal
liver abnormality. No gallstones, gallbladder wall thickening, or
pericholecystic fluid. No biliary dilatation.

Pancreas: No focal lesion. Normal pancreatic contour. No surrounding
inflammatory changes. No main pancreatic ductal dilatation.

Spleen: Normal in size without focal abnormality.

Adrenals/Urinary Tract:

No adrenal nodule bilaterally.

Possible punctate calcification of the right kidney. Otherwise no
obstructive nephrolithiasis, no frank hydronephrosis, and no
contour-deforming renal mass. No ureterolithiasis. Fullness of the
right collecting system with possible punctate right
ureterovesicular junction stone. No left hydroureter.

The urinary bladder is unremarkable.

Stomach/Bowel: Stomach is within normal limits. No evidence of bowel
wall thickening or dilatation. Appendix appears normal.

Vascular/Lymphatic: No significant vascular findings are present. No
enlarged abdominal or pelvic lymph nodes.

Reproductive: Uterus and bilateral adnexa are unremarkable. Tubal
ligation bilaterally.

Other: No intraperitoneal free fluid. No intraperitoneal free gas.
No organized fluid collection.

Musculoskeletal: No acute or significant osseous findings.
IMPRESSION: 1. Fullness of the right collecting system with possible punctate
right ureterovesicular junction stone.
2. Nonobstructive punctate right nephrolithiasis.

## 2024-04-10 ENCOUNTER — Other Ambulatory Visit: Payer: Self-pay | Admitting: Family Medicine

## 2024-04-10 DIAGNOSIS — N6320 Unspecified lump in the left breast, unspecified quadrant: Secondary | ICD-10-CM

## 2024-04-10 DIAGNOSIS — N649 Disorder of breast, unspecified: Secondary | ICD-10-CM
